# Patient Record
Sex: Female | Born: 1965 | Race: Black or African American | Hispanic: No | Marital: Single | Smoking: Never smoker
Health system: Southern US, Community
[De-identification: ages and names within clinical notes are randomized; demographics above are authoritative.]

## PROBLEM LIST (undated history)

## (undated) DIAGNOSIS — C801 Malignant (primary) neoplasm, unspecified: Secondary | ICD-10-CM

## (undated) DIAGNOSIS — C189 Malignant neoplasm of colon, unspecified: Secondary | ICD-10-CM

## (undated) DIAGNOSIS — T7840XA Allergy, unspecified, initial encounter: Secondary | ICD-10-CM

## (undated) HISTORY — PX: TUBAL LIGATION: SHX77

## (undated) HISTORY — PX: COLON SURGERY: SHX602

## (undated) HISTORY — DX: Allergy, unspecified, initial encounter: T78.40XA

---

## 1997-08-22 ENCOUNTER — Inpatient Hospital Stay (HOSPITAL_COMMUNITY): Admission: AD | Admit: 1997-08-22 | Discharge: 1997-08-22 | Payer: Self-pay | Admitting: Obstetrics and Gynecology

## 1997-09-17 ENCOUNTER — Other Ambulatory Visit: Admission: RE | Admit: 1997-09-17 | Discharge: 1997-09-17 | Payer: Self-pay | Admitting: Obstetrics & Gynecology

## 1997-10-20 ENCOUNTER — Inpatient Hospital Stay (HOSPITAL_COMMUNITY): Admission: AD | Admit: 1997-10-20 | Discharge: 1997-10-22 | Payer: Self-pay | Admitting: Gynecology

## 1999-09-28 ENCOUNTER — Encounter: Payer: Self-pay | Admitting: Emergency Medicine

## 1999-09-28 ENCOUNTER — Emergency Department (HOSPITAL_COMMUNITY): Admission: EM | Admit: 1999-09-28 | Discharge: 1999-09-28 | Payer: Self-pay | Admitting: Emergency Medicine

## 2000-09-24 ENCOUNTER — Emergency Department (HOSPITAL_COMMUNITY): Admission: EM | Admit: 2000-09-24 | Discharge: 2000-09-24 | Payer: Self-pay

## 2000-09-28 ENCOUNTER — Emergency Department (HOSPITAL_COMMUNITY): Admission: EM | Admit: 2000-09-28 | Discharge: 2000-09-28 | Payer: Self-pay | Admitting: Emergency Medicine

## 2001-03-09 ENCOUNTER — Encounter: Payer: Self-pay | Admitting: Emergency Medicine

## 2001-03-09 ENCOUNTER — Emergency Department (HOSPITAL_COMMUNITY): Admission: EM | Admit: 2001-03-09 | Discharge: 2001-03-09 | Payer: Self-pay | Admitting: Emergency Medicine

## 2001-11-27 ENCOUNTER — Emergency Department (HOSPITAL_COMMUNITY): Admission: EM | Admit: 2001-11-27 | Discharge: 2001-11-27 | Payer: Self-pay | Admitting: Emergency Medicine

## 2002-08-06 ENCOUNTER — Emergency Department (HOSPITAL_COMMUNITY): Admission: EM | Admit: 2002-08-06 | Discharge: 2002-08-06 | Payer: Self-pay | Admitting: Emergency Medicine

## 2002-08-06 ENCOUNTER — Encounter: Payer: Self-pay | Admitting: Emergency Medicine

## 2002-11-06 ENCOUNTER — Emergency Department (HOSPITAL_COMMUNITY): Admission: EM | Admit: 2002-11-06 | Discharge: 2002-11-06 | Payer: Self-pay

## 2002-11-06 ENCOUNTER — Encounter: Payer: Self-pay | Admitting: Emergency Medicine

## 2003-09-30 ENCOUNTER — Other Ambulatory Visit: Admission: RE | Admit: 2003-09-30 | Discharge: 2003-09-30 | Payer: Self-pay | Admitting: Obstetrics & Gynecology

## 2003-10-25 ENCOUNTER — Ambulatory Visit (HOSPITAL_COMMUNITY): Admission: RE | Admit: 2003-10-25 | Discharge: 2003-10-25 | Payer: Self-pay | Admitting: Obstetrics & Gynecology

## 2004-11-30 ENCOUNTER — Other Ambulatory Visit: Admission: RE | Admit: 2004-11-30 | Discharge: 2004-11-30 | Payer: Self-pay | Admitting: Obstetrics & Gynecology

## 2005-10-03 ENCOUNTER — Emergency Department (HOSPITAL_COMMUNITY): Admission: EM | Admit: 2005-10-03 | Discharge: 2005-10-04 | Payer: Self-pay | Admitting: Emergency Medicine

## 2005-10-19 ENCOUNTER — Encounter: Admission: RE | Admit: 2005-10-19 | Discharge: 2006-01-17 | Payer: Self-pay | Admitting: Specialist

## 2005-12-05 ENCOUNTER — Emergency Department (HOSPITAL_COMMUNITY): Admission: EM | Admit: 2005-12-05 | Discharge: 2005-12-05 | Payer: Self-pay | Admitting: Emergency Medicine

## 2006-01-14 ENCOUNTER — Emergency Department (HOSPITAL_COMMUNITY): Admission: EM | Admit: 2006-01-14 | Discharge: 2006-01-14 | Payer: Self-pay | Admitting: Emergency Medicine

## 2006-01-30 ENCOUNTER — Emergency Department (HOSPITAL_COMMUNITY): Admission: EM | Admit: 2006-01-30 | Discharge: 2006-01-30 | Payer: Self-pay | Admitting: Emergency Medicine

## 2006-02-07 ENCOUNTER — Encounter: Admission: RE | Admit: 2006-02-07 | Discharge: 2006-05-08 | Payer: Self-pay | Admitting: Specialist

## 2006-05-09 ENCOUNTER — Encounter: Admission: RE | Admit: 2006-05-09 | Discharge: 2006-08-07 | Payer: Self-pay | Admitting: Specialist

## 2007-06-30 ENCOUNTER — Inpatient Hospital Stay (HOSPITAL_COMMUNITY): Admission: AD | Admit: 2007-06-30 | Discharge: 2007-07-01 | Payer: Self-pay | Admitting: Obstetrics & Gynecology

## 2008-01-09 ENCOUNTER — Inpatient Hospital Stay (HOSPITAL_COMMUNITY): Admission: AD | Admit: 2008-01-09 | Discharge: 2008-01-09 | Payer: Self-pay | Admitting: Obstetrics and Gynecology

## 2008-01-29 ENCOUNTER — Inpatient Hospital Stay (HOSPITAL_COMMUNITY): Admission: AD | Admit: 2008-01-29 | Discharge: 2008-01-29 | Payer: Self-pay | Admitting: Obstetrics and Gynecology

## 2008-02-03 ENCOUNTER — Inpatient Hospital Stay (HOSPITAL_COMMUNITY): Admission: AD | Admit: 2008-02-03 | Discharge: 2008-02-03 | Payer: Self-pay | Admitting: Obstetrics & Gynecology

## 2008-02-08 ENCOUNTER — Inpatient Hospital Stay (HOSPITAL_COMMUNITY): Admission: AD | Admit: 2008-02-08 | Discharge: 2008-02-11 | Payer: Self-pay | Admitting: Obstetrics and Gynecology

## 2008-02-08 ENCOUNTER — Encounter (INDEPENDENT_AMBULATORY_CARE_PROVIDER_SITE_OTHER): Payer: Self-pay | Admitting: Obstetrics & Gynecology

## 2008-02-16 ENCOUNTER — Inpatient Hospital Stay (HOSPITAL_COMMUNITY): Admission: EM | Admit: 2008-02-16 | Discharge: 2008-02-20 | Payer: Self-pay | Admitting: Emergency Medicine

## 2008-02-16 ENCOUNTER — Ambulatory Visit: Payer: Self-pay | Admitting: *Deleted

## 2008-02-19 ENCOUNTER — Encounter (INDEPENDENT_AMBULATORY_CARE_PROVIDER_SITE_OTHER): Payer: Self-pay | Admitting: Internal Medicine

## 2008-12-19 ENCOUNTER — Encounter: Admission: RE | Admit: 2008-12-19 | Discharge: 2008-12-19 | Payer: Self-pay | Admitting: Obstetrics & Gynecology

## 2009-03-31 ENCOUNTER — Emergency Department (HOSPITAL_BASED_OUTPATIENT_CLINIC_OR_DEPARTMENT_OTHER): Admission: EM | Admit: 2009-03-31 | Discharge: 2009-03-31 | Payer: Self-pay | Admitting: *Deleted

## 2010-02-16 ENCOUNTER — Ambulatory Visit: Payer: Self-pay | Admitting: Family Medicine

## 2010-02-16 ENCOUNTER — Encounter: Payer: Self-pay | Admitting: Family Medicine

## 2010-02-16 DIAGNOSIS — M722 Plantar fascial fibromatosis: Secondary | ICD-10-CM | POA: Insufficient documentation

## 2010-02-16 DIAGNOSIS — R0609 Other forms of dyspnea: Secondary | ICD-10-CM | POA: Insufficient documentation

## 2010-02-16 DIAGNOSIS — E049 Nontoxic goiter, unspecified: Secondary | ICD-10-CM | POA: Insufficient documentation

## 2010-02-16 DIAGNOSIS — R0989 Other specified symptoms and signs involving the circulatory and respiratory systems: Secondary | ICD-10-CM

## 2010-02-16 DIAGNOSIS — R5383 Other fatigue: Secondary | ICD-10-CM

## 2010-02-16 DIAGNOSIS — R5381 Other malaise: Secondary | ICD-10-CM | POA: Insufficient documentation

## 2010-02-16 DIAGNOSIS — E669 Obesity, unspecified: Secondary | ICD-10-CM | POA: Insufficient documentation

## 2010-02-16 DIAGNOSIS — J309 Allergic rhinitis, unspecified: Secondary | ICD-10-CM | POA: Insufficient documentation

## 2010-02-17 ENCOUNTER — Telehealth (INDEPENDENT_AMBULATORY_CARE_PROVIDER_SITE_OTHER): Payer: Self-pay | Admitting: *Deleted

## 2010-02-17 LAB — CONVERTED CEMR LAB: Vit D, 25-Hydroxy: 34 ng/mL (ref 30–89)

## 2010-02-19 ENCOUNTER — Telehealth (INDEPENDENT_AMBULATORY_CARE_PROVIDER_SITE_OTHER): Payer: Self-pay | Admitting: *Deleted

## 2010-02-19 LAB — CONVERTED CEMR LAB
ALT: 17 units/L (ref 0–35)
Albumin: 3.8 g/dL (ref 3.5–5.2)
Eosinophils Absolute: 0 10*3/uL (ref 0.0–0.7)
Eosinophils Relative: 1.5 % (ref 0.0–5.0)
MCV: 96.3 fL (ref 78.0–100.0)
Monocytes Absolute: 0.3 10*3/uL (ref 0.1–1.0)
Monocytes Relative: 14.3 % — ABNORMAL HIGH (ref 3.0–12.0)
Neutrophils Relative %: 39.1 % — ABNORMAL LOW (ref 43.0–77.0)
Platelets: 220 10*3/uL (ref 150.0–400.0)
RBC: 3.82 M/uL — ABNORMAL LOW (ref 3.87–5.11)
RDW: 13.8 % (ref 11.5–14.6)
Total Bilirubin: 0.5 mg/dL (ref 0.3–1.2)
WBC: 2.4 10*3/uL — ABNORMAL LOW (ref 4.5–10.5)

## 2010-02-24 ENCOUNTER — Encounter
Admission: RE | Admit: 2010-02-24 | Discharge: 2010-02-24 | Payer: Self-pay | Source: Home / Self Care | Attending: Family Medicine | Admitting: Family Medicine

## 2010-02-24 ENCOUNTER — Ambulatory Visit: Payer: Self-pay | Admitting: Pulmonary Disease

## 2010-02-24 ENCOUNTER — Ambulatory Visit: Payer: Self-pay | Admitting: Family Medicine

## 2010-02-25 ENCOUNTER — Ambulatory Visit (HOSPITAL_BASED_OUTPATIENT_CLINIC_OR_DEPARTMENT_OTHER)
Admission: RE | Admit: 2010-02-25 | Discharge: 2010-02-25 | Payer: Self-pay | Source: Home / Self Care | Attending: Pulmonary Disease | Admitting: Pulmonary Disease

## 2010-02-25 ENCOUNTER — Encounter: Payer: Self-pay | Admitting: Pulmonary Disease

## 2010-02-25 LAB — CONVERTED CEMR LAB
Basophils Relative: 0.4 % (ref 0.0–3.0)
Eosinophils Absolute: 0.2 10*3/uL (ref 0.0–0.7)
HCT: 37.3 % (ref 36.0–46.0)
Lymphocytes Relative: 25.5 % (ref 12.0–46.0)
Lymphs Abs: 2.3 10*3/uL (ref 0.7–4.0)
MCHC: 34 g/dL (ref 30.0–36.0)
MCV: 95.1 fL (ref 78.0–100.0)
Monocytes Relative: 6.8 % (ref 3.0–12.0)

## 2010-02-26 ENCOUNTER — Encounter: Admit: 2010-02-26 | Payer: Self-pay | Admitting: Family Medicine

## 2010-03-07 ENCOUNTER — Telehealth: Payer: Self-pay | Admitting: Pulmonary Disease

## 2010-03-19 ENCOUNTER — Other Ambulatory Visit: Payer: Self-pay | Admitting: Obstetrics & Gynecology

## 2010-03-22 ENCOUNTER — Encounter: Payer: Self-pay | Admitting: Family Medicine

## 2010-03-27 ENCOUNTER — Encounter
Admission: RE | Admit: 2010-03-27 | Discharge: 2010-03-27 | Payer: Self-pay | Source: Home / Self Care | Attending: Obstetrics & Gynecology | Admitting: Obstetrics & Gynecology

## 2010-04-02 NOTE — Progress Notes (Signed)
  Phone Note Outgoing Call   Summary of Call: Let her know - stopped breathing very few times - not enough to warrant intervention, wt loss advised, increase sleep time to at least 7 hrs/ night if possible Initial call taken by: Comer Locket. Vassie Loll MD,  March 07, 2010 10:02 PM  Follow-up for Phone Call        informed pt of RA's recs. Zackery Barefoot CMA  March 10, 2010 10:05 AM

## 2010-04-02 NOTE — Assessment & Plan Note (Signed)
Summary: cpx ///sph--needs afternoon appt///sph   Vital Signs:  Patient profile:   45 year old female Height:      63 inches Weight:      226 pounds BMI:     40.18 Pulse rate:   82 / minute BP sitting:   114 / 80  (left arm)  Vitals Entered By: Doristine Devoid CMA (February 16, 2010 2:57 PM) CC: NEW EST- husband complains of snoring and having bilateral feet and ankle pain    History of Present Illness: 45 yo woman here today to establish care.  previous MD- none.  GYNAldona Bar.  1) Fatigue- started a new job.  has 44 yo boy  and 2 yo daughter.  despite getting adequate sleep is waking up tired and having day time sleepiness.  2) Snoring- husband c/o pt's snoring and reports that she stops breathing multiple times/night.  he reports this is a recent development.  pt reports she has recently gained weight.  3) Obesity- 'i feel like i'm failing this food thing- i'm failing horribly'.  'i'm out of control'.  a few months ago tried physicians weight loss.  not exercising.  4) foot pain- R foot has arch and heel pain.  sxs started 'a long time ago'.  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  1st step in AM is most painful.  L foot has pain along the dorsal/lateral foot along w/ similar arch pain.  Preventive Screening-Counseling & Management  Alcohol-Tobacco     Alcohol drinks/day: <1     Alcohol type: wine     Smoking Status: never  Caffeine-Diet-Exercise     Does Patient Exercise: no      Sexual History:  currently monogamous.        Drug Use:  never.    Current Medications (verified): 1)  Multivitamins  Caps (Multiple Vitamin) .... Take One Tablet Daily  Allergies: 1)  ! Amoxicillin  Past History:  Past Medical History: hx of chicken pox  Past Surgical History: csection- 2009  Family History: CAD-no HTN-mother DM-no STROKE-no COLON CA-no BREAST CA-no  Social History: married 2 children- daughter (2009), son (1999) Cosmetologist callibrates meters at  [employer] Smoking Status:  never Does Patient Exercise:  no Sexual History:  currently monogamous Drug Use:  never  Review of Systems      See HPI  Physical Exam  General:  Well-developed,well-nourished,in no acute distress; alert,appropriate and cooperative throughout examination.  overwt Head:  NCAT Eyes:  PERRL, EOMI, fundi WNL Nose:  markedly edematous turbinates Mouth:  crowded posterior pharynx Neck:  thyromegaly Lungs:  Normal respiratory effort, chest expands symmetrically. Lungs are clear to auscultation, no crackles or wheezes. Heart:  Normal rate and regular rhythm. S1 and S2 normal without gallop, murmur, click, rub or other extra sounds. Abdomen:  soft, NT/ND, +BS Msk:  + TTP over plantar fascia bilaterally Pulses:  +2 carotid, radial, DP Extremities:  No clubbing, cyanosis, edema, or deformity noted with normal full range of motion of all joints.   Skin:  turgor normal and color normal.   Cervical Nodes:  No lymphadenopathy noted Psych:  Cognition and judgment appear intact. Alert and cooperative with normal attention span and concentration. No apparent delusions, illusions, hallucinations   Impression & Recommendations:  Problem # 1:  FATIGUE (ICD-780.79) Assessment New likely multifactorial- working 2 jobs, has 2 children, not exercising or eating well, ? sleep apnea.  check labs to r/o metabolic disturbance or thyroid abnormality.  reviewed sleep hygeine. Orders: Venipuncture (  [phone]) T-Vitamin D (25-Hydroxy) [phone]) Specimen Handling (08657) TLB-TSH (Thyroid Stimulating Hormone) (84443-TSH) TLB-CBC Platelet - w/Differential (85025-CBCD) TLB-Hepatic/Liver Function Pnl (80076-HEPATIC)  Problem # 2:  SNORING (ICD-786.09) Assessment: New pt's sxs and recent weight gain consistent w/ sleep apnea.  refer to pulm for evaluation and tx. Orders: Pulmonary Referral (Pulmonary)  Problem # 3:  PLANTAR FASCIITIS, BILATERAL (ICD-728.71) Assessment: New start  NSAIDs.  demonstrated stretching.  recommended ice and cushioned shoes. Her updated medication list for this problem includes:    Naprosyn 500 Mg Tabs (Naproxen) .Marland Kitchen... 1 two times a day x10 days and then as needed.  take w/ food.  Problem # 4:  OBESITY (ICD-278.00) Assessment: New pt not paying attention to diet or exercising.  discussed importance of both, refer to nutrition. Orders: Nutrition Referral (Nutrition)  Problem # 5:  THYROMEGALY (ICD-240.9) Assessment: New refer for [place] Orders: Radiology Referral (Radiology)  Problem # 6:  RHINITIS (ICD-477.9) Assessment: New start nasal steroid to decrease airway congestion Her updated medication list for this problem includes:    Fluticasone Propionate 50 Mcg/act Susp (Fluticasone propionate) .Marland Kitchen... 2 sprays each nostril daily  Complete Medication List: 1)  Multivitamins Caps (Multiple vitamin) .... Take one tablet daily 2)  Naprosyn 500 Mg Tabs (Naproxen) .Marland Kitchen.. 1 two times a day x10 days and then as needed.  take w/ food. 3)  Fluticasone Propionate 50 Mcg/act Susp (Fluticasone propionate) .... 2 sprays each nostril daily  Patient Instructions: 1)  Please schedule a fasting lab visit at your convenience 2)  BMP- 278.0 3)  Lipid panel- 278.0 4)  We'll notify you of your lab results 5)  Someone will call you with your thyroid ultrasound and sleep study appts 6)  Start the Nasonex to decrease your nasal congestion caused by allergies 7)  We'll also refer you to a nutritionist to help with the weight loss 8)  Ice your feet at least two times a day- roll over the soda bottle 9)  Do the stretches I showed you for your feet at least 2x/day 10)  Call with any questions or concerns 11)  Welcome!  We're glad to have you!!! Prescriptions: NASONEX 50 MCG/ACT SUSP (MOMETASONE FUROATE) 2 sprays each nostril once daily  #1 x 3   Entered and Authorized by:   [name] MD   Signed by:   [name] MD on 02/16/2010   Method used:    [facility]        [facility] [phone]* (retail)       [address]       [place], [place]  [address]       Ph: [phone]       Fax: [phone]   RxID:   [id number] NAPROSYN 500 MG TABS (NAPROXEN) 1 two times a day x10 days and then as needed.  take w/ food.  #60 x 1   Entered and Authorized by:   [name] MD   Signed by:   [name] MD on 02/16/2010   Method used:   [facility] to        [facility] [phone]* (retail)       [address]       [place], [place]  [address]       Ph: [phone]       Fax: [phone]   RxID:   [id number]    Orders Added: 1)  Venipuncture [id number] 2)  T-Vitamin D (25-Hydroxy) [phone] 3)  Specimen Handling [99000] 4)  Nutrition Referral [Nutrition] 5)  Pulmonary Referral [Pulmonary] 6)  Radiology Referral [Radiology] 7)  TLB-TSH (Thyroid Stimulating Hormone) [84443-TSH] 8)  TLB-CBC Platelet - w/Differential [85025-CBCD] 9)  TLB-Hepatic/Liver Function Pnl [80076-HEPATIC] 10)  New Patient Level III [09811]

## 2010-04-02 NOTE — Assessment & Plan Note (Signed)
Summary: SNORING/MH   Visit Type:  Initial Consult Copy to:  pcp Primary [name]/Referring [name]:  [name] MD  CC:  Snoring.  History of Present Illness: 44/F for evaluation of sleep disordered brathing husband notes loud snoring, witnessed apneas Exhausted x few months , new job since october  bedtime 10 p, 45 y.o. in her own bed, sleeps on her side x  3p, no nocturia, oob at 0500, tired, occ headaches, 1 cup coffee Epworth Sleepiness Score 15 Days off work - oob at 0700,  naps x1h, somewhat refrshing Gained 20 lbs last 2 y  Preventive Screening-Counseling & Management  Alcohol-Tobacco     Alcohol drinks/day: occ     Alcohol type: wine     Smoking Status: quit     Packs/Day: 0.25     Year Started: 1991     Year Quit: 1996   History of Present Illness: Snoring, sound like I stop breathing, wake up tired  What time do you typically go to bed?(between what hours): 10-11pm   How many times during the night do you wake up? I don't know  What time do you get out of bed to start your day? 5am  Do you drive or operate heavy machinery in your occupation? no  How much has your weight changed (up or down) over the past two years? (in pounds): 30 lb increase  Have you ever had a sleep study before?  If yes,when and where: no  Do you currently use CPAP ? If so , at what pressure? no  Do you wear oxygen at any time? If yes, how many liters per minute? no Current Medications (verified): 1)  Multivitamins  Caps (Multiple Vitamin) .... Take One Tablet Daily 2)  Naprosyn 500 Mg Tabs (Naproxen) .Marland Kitchen.. 1 Two Times A Day X10 Days and Then As Needed.  Take W/ Food. 3)  Fluticasone Propionate 50 Mcg/act Susp (Fluticasone Propionate) .... 2 Sprays Each Nostril Daily  Allergies (verified): 1)  ! 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  Smoking Status:  quit Packs/Day:  0.25 Alcohol drinks/day:  occ  Review of Systems       The patient complains of shortness of breath with activity, nasal congestion/difficulty breathing through nose, sneezing, depression, hand/feet swelling, and joint stiffness or pain.  The patient denies shortness of breath at rest, productive cough, non-productive cough, coughing up blood, chest pain, irregular heartbeats, acid heartburn, indigestion, loss of appetite, weight change, abdominal pain, difficulty swallowing, sore throat, tooth/dental problems, headaches, itching, ear ache, anxiety, rash, change in color of mucus, and fever.    Vital Signs:  Patient profile:   45 year old female Height:      63.25 inches Weight:      229.2 pounds BMI:     40.43 O2 Sat:      100 % on Room air Temp:     98.1 degrees F oral Pulse rate:   74 / minute BP sitting:   100 / 74  (left arm) Cuff  size:   large  Vitals Entered By: Zackery Barefoot CMA (February 24, 2010 11:02 AM)  O2 Flow:  Room air CC: Snoring Comments Medications reviewed with patient Verified contact number and pharmacy with patient Zackery Barefoot CMA  February 24, 2010 11:02 AM    Physical Exam  Additional Exam:  Gen. Pleasant, well-nourished, in no distress, normal affect ENT - no lesions, no post nasal drip, class 3 airway, prognathia Neck: No JVD, no thyromegaly, no carotid  bruits Lungs: no use of accessory muscles, no dullness to percussion, clear without rales or rhonchi  Cardiovascular: Rhythm regular, heart sounds  normal, no murmurs or gallops, no peripheral edema Abdomen: soft and non-tender, no hepatosplenomegaly, BS normal. Musculoskeletal: No deformities, no cyanosis or clubbing Neuro:  alert, non focal     Impression & Recommendations:  Problem # 1:  SNORING (ICD-786.09)  Given wtinessed apneas, excessive daytime somnolence & loud snoring, obstructive sleep apnea is very likely & an overnight PSG will be scheduled as a split study The pathophysiology of obstructive sleep apnea, it's cardiovascular consequences and modes of treatment including CPAP were discussed with the patient in great detail.   Orders: Sleep Disorder Referral (Sleep Disorder) Consultation Level III (16109)  Patient Instructions: 1)  Copy sent  dr Beverely Low 2)  Please schedule a follow-up appointment in 2 weeks after sleep study

## 2010-04-02 NOTE — Progress Notes (Signed)
Summary: labs  Phone Note Outgoing Call   Call placed by: Doristine Devoid CMA,  February 19, 2010 2:17 PM Call placed to: Patient Summary of Call: WBC is mildly low- this could be a normal variation but should be repeated in 1 week to trend.  thyroid is normal, she's not anemic, and liver is normal  Follow-up for Phone Call        spoke w/ patient aware of labs and appt scheduled to recheck labs........Marland KitchenDoristine Devoid CMA  February 19, 2010 2:20 PM

## 2010-04-02 NOTE — Progress Notes (Signed)
Summary: either med available in generic??  Phone Note Refill Request Message from:  Patient on February 17, 2010 1:06 PM  Refills Requested: Medication #1:  NAPROSYN 500 MG TABS 1 two times a day x10 days and then as needed.  take w/ food.  Medication #2:  NASONEX 50 MCG/ACT SUSP 2 sprays each nostril once daily. are either of these medciations available in generic that Medicaid will pay for???    says that two prescriptions will cost her $185.00!!!    cannot use new primary  insurance  at pharmacy, only can use Medicaid-----please call cell [phone]--ok to leave message, but she should answer       [facility] street  Initial call taken by: Jerolyn Shin,  February 17, 2010 1:09 PM  Follow-up for Phone Call        Pls advise.........Marland KitchenFelecia Deloach CMA  February 17, 2010 2:45 PM   Additional Follow-up for Phone Call Additional follow up Details #1::        nasonex no---it can be switched to flonase or nasacort naprosyn the pharmacy should have filled with generic Additional Follow-up by: Loreen Freud DO,  February 17, 2010 3:19 PM    Additional Follow-up for Phone Call Additional follow up Details #2::    Left message to call office...........Marland KitchenFelecia Deloach CMA  February 17, 2010 3:53 PM   Additional Follow-up for Phone Call Additional follow up Details #3:: Details for Additional Follow-up Action Taken: naprosyn is on the $4 list at [facility].  fluticasone is a generic nasal spray- still 2 sprays each nostril daily.  Pt aware...........Marland KitchenFelecia Deloach CMA  February 18, 2010 9:10 AM  Additional Follow-up by: [name] MD,  February 17, 2010 8:48 PM  New/Updated Medications: FLUTICASONE PROPIONATE 50 MCG/ACT SUSP (FLUTICASONE PROPIONATE) 2 sprays each nostril daily Prescriptions: FLUTICASONE PROPIONATE 50 MCG/ACT SUSP (FLUTICASONE PROPIONATE) 2 sprays each nostril daily  #1 x 3   Entered by:   Jeremy Johann CMA   Authorized by:    Loreen Freud DO   Signed by:   Jeremy Johann CMA on 02/18/2010   Method used:   [facility]ed to ...       [facility] [phone]* (retail)       [address]       [place], [place]  [address]       Ph: [phone]       Fax: [phone]   RxID:   [id number]

## 2010-07-14 NOTE — H&P (Signed)
[name], [name]               ACCOUNT NO.:  [id number]   MEDICAL RECORD NO.:  [id number]          PATIENT TYPE:  INP   LOCATION:  0107                         FACILITY:  [facility]   PHYSICIAN:  Vania Rea, M.D. DATE OF BIRTH:  02/04/1966   DATE OF ADMISSION:  02/16/2008  DATE OF DISCHARGE:                              HISTORY & PHYSICAL   PRIMARY CARE PHYSICIAN:  Unassigned.   OBSTETRICIAN/GYNECOLOGIST:  Gerrit Friends. Aldona Bar, M.D.   CHIEF COMPLAINT:  Shortness of breath and chest tightness for the past  24 hours.   HISTORY OF PRESENT ILLNESS:  This is a 45 year old African American lady  who delivered her second child by cesarean section 8 days ago, had lower  extremity edema immediately postpartum but has been ambulating and told  that would eventually clear.  She was discharged home after a 3-day  hospital stay and has been breast feeding and active without too much  difficulty other than the soreness of her abdomen status post a cesarean  section.  Since yesterday the patient has been having what she called a  chest tightness and some shortness of breath.  She did have chills last  night but has not noticed any frank fever.  She says she has been having  a dry cough but she denies chest pain.  She denies orthopnea or PND.  She reports that the swelling of her legs is not worse.  The patient  eventually came to the emergency room to be evaluated and the  hospitalist service was called to assist in management.   PAST MEDICAL HISTORY:  She does have past history of bronchitis.  No  history of heart disease and no history of thrombocytopenia.  No history  of thyroid disease.   MEDICATIONS:  Colace 100 mg daily, prenatal vitamins 1 daily, Vicodin  5/500 one to two tablets every 4 hours when necessary, ibuprofen 800 mg  3 times daily.   ALLERGIES:  PENICILLIN.  She does not know the reaction; it was so long  ago.   SOCIAL HISTORY:  She denies tobacco, alcohol or illicit drug  use.  She  works as a Associate Professor.   FAMILY HISTORY:  Significant for hypertension in her mother and brother.   REVIEW OF SYSTEMS:  Other than noted above, a 10-point review of systems  is unremarkable.   PHYSICAL EXAM:  A young African American lady reclining on the  stretcher, somewhat obese, but in no acute distress.  She has received  Lasix and Avelox.  VITALS:  Her temperature is 98.8.  Her pulse is 60, respirations 22,  blood pressure 128/69.  She is saturating 96% on room air.  Her pupils  round, equal and reactive.  Mucous mucous membranes are pink and  anicteric.  She is not dehydrated.  No cervical lymphadenopathy.  Her thyroid is somewhat prominent.  Her chest is mostly clear.  She does have occasional rhonchi.  Cardiovascular system regular rhythm without murmur or gallop.  Her abdomen is obese but soft.  She does have some evidence of  intertriginous Candida in the folds of her pannus.  EXTREMITIES:  She has tense edema of 3+ bilaterally, right greater than  left, but no tenderness and no evidence of inflammation.  CENTRAL NERVOUS SYSTEM:  Cranial nerves II-XII are grossly intact.  She  has no focal neurologic deficit.   LABS:  Her CBC is compatible with postpartum.  Her white count is normal  at 9.5.  Her hemoglobin is a little low at 10.4, MCV is 100.3, her  platelets are 276.  She has 79% neutrophils.  Her absolute granulocyte  count is normal at 7.5.  Her serum chemistry is completely normal.  BUN  14 and creatinine 0.7.  Her brain natriuretic peptide is slightly  elevated at 347.  Her EKG shows a normal sinus rhythm and, in fact,  sinus bradycardia with 52 beats per minute when initially done.  A chest  x-ray, two views, shows diffuse peribronchial thickening and a right  lower lobe pneumonia with a small right pleural effusion.   ASSESSMENT:  1. Lobar pneumonia status post cesarean section with probable      consequent decreased ventilation.  2. Bilateral  lower extremity edema started in the acute postpartum.  3. Postpartum period acute bronchitis.   PLAN:  1. Will bring this lady in to start her on antibiotics.  Will give her      incentive spirometry on a flutter valve. Because there is still a      possibility that with this she may have some postpartum      cardiomyopathy, will go ahead and get an echo.  This does not have      the appearance of a pulmonary embolus but will go ahead and get      bilateral lower extremity Dopplers and if positive, we will go      ahead and get a CT angiogram.  2. The patient has a history of PENICILLIN ALLERGY but Rocephin and      Zithromax in this patient is preferable to Avelox, which is a      quinolone, and so will go ahead and give Rocephin but will monitor      for any signs of allergy.  3. Because of her prominent thyroid, will go ahead and check her TSH      and free T4.  Other plans as per orders.      Vania Rea, M.D.  Electronically Signed     LC/MEDQ  D:  02/16/2008  T:  02/16/2008  Job:  [id number]   cc:   Gerrit Friends. Aldona Bar, M.D.  Fax: [phone]

## 2010-07-14 NOTE — Op Note (Signed)
Glenda Hudson, AUER               ACCOUNT NO.:  [id number]   MEDICAL RECORD NO.:  [id number]          PATIENT TYPE:  INP   LOCATION:  9127                          FACILITY:  [facility]   PHYSICIAN:  Gerrit Friends. Aldona Bar, M.D.   DATE OF BIRTH:  1965/04/25   DATE OF PROCEDURE:  02/08/2008  DATE OF DISCHARGE:                               OPERATIVE REPORT   PREOPERATIVE DIAGNOSES:  1. Term pregnancy.  2. Active labor.  3. Failure to progress.  4. Desire for permanent elective sterilization.   POSTOPERATIVE DIAGNOSES:  1. Term pregnancy.  2. Active labor.  3. Failure to progress.  4. Desire for permanent elective sterilization.  5. Delivery of 8 pounds 1 ounce female infant, Apgars 8 and 9 and      segments of each fallopian tube - pathology pending.   PROCEDURE:  Primary low transverse cesarean section and tubal  sterilization procedure.   SURGEON:  Gerrit Friends. Aldona Bar, MD   ANESTHESIA:  Augmented epidural.   HISTORY:  This is gravida 2, para 27, 45 year old patient entered at term  in labor on the morning of February 08, 2008.  At the time of admission,  she was about 6 cm dilated with bulging membranes and the vertex was  about -3 station - ballottable.  She progressed to about 7-8 cm of  dilatation by 9:00 a.m. and was allowed to labor.  She felt to be almost  fully dilated with a vertex of about -2 station at around 1:00 p.m. and  on examination, ruptured membranes occurred with production of slightly  meconium-stained fluid.  Fetal heart rate was reassuring.  Pushing was  begun.  Unfortunately, during the attempted second stage, her cervix  became very edematous and on reevaluation after approximately 30 minutes  of pushing, was no more than 7 cm dilated - similarly to what she was  about 5 hours earlier.  She was allowed to rest for approximately an  hour and there was really no change despite of continued good  contractions.  Because of failure to progress, she was taken to the  operating for primary low transverse cesarean section and because she  was 45 years old and had previously thought about a sterilization  procedure, even though Medicaid papers were not signed, she elected for  a permanent elective sterilization procedure and decision was made to go  ahead and carry that out at that time of her cesarean section after  informed consent was given by the patient.   Accordingly, she was taken to the operating room where after the  satisfactory augmentation of her epidural and a dose of Ancef, 1 gram  IV, she was prepped and draped in usual fashion having placed in supine  position slightly tilted to the left.  A Foley catheter had been  previously inserted in the labor and delivery area.   Once she was adequately draped and good anesthetic levels were  documented, procedure was begun.  A Pfannenstiel incision was made and  with minimal difficulty dissected down sharply to and through the fascia  in low transverse fashion with hemostasis  created at each layer.  Subfascial space was created inferiorly and superiorly and muscles  separated midline.  Identified and appropriate with care taken to avoid  the bowel superiorly and the bladder inferiorly.  At this time, the  vesicouterine peritoneum was identified, incised in low transverse  fashion, pushed off the lower uterine segment with ease and sharp  incision into the lower segment was carried out using Metzenbaum  scissors and extended laterally with the fingers.  Thereafter, elevation  of the vertex was carried out and delivery of a viable female infant  which cried spontaneously once was accomplished.  After the cord was  clamped and cut, the infant was passed off the awaiting team and  subsequently taken to nursery in good condition.  Subsequent weight was  found to be 8 pounds 1 ounce and Apgars were noted to be 8 and 9.   As the patient was a cord blood donor, placenta was delivered and passed  off.   Cord bloods will be obtained by the cord blood donation team and  placenta will subsequently be sent to pathology as she did have meconium-  stained fluid.   At this time, the uterus was exteriorized and rendered free of any  remaining products of conception.  Good uterine contractility was  afforded, was slowly given intravenous Pitocin and manual stimulation.  At this time, closure of the uterine incision was carried out using #1  Vicryl in a running-locking fashion and this was oversewn with several  figure-of-eight #1 Vicryl for additional hemostasis.  At this time, the  uterine incision was closed and hemostatic.  Attention was turned to  each fallopian tube which appeared normal with the exception of some  very filmy adhesions.  Nonetheless, it was possible to carry out a  Pomeroy tubal sterilization procedure on each fallopian tube using #1  plain catgut suture and the knuckle of tube was excised from each  fallopian tube and sent to pathology, appropriately labeled.  Hemostasis  was adequate.  At this time, the uterus is well contracted.  Uterine  incision was well approximated and hemostatic and with all counts being  correct and no foreign bodies noted to be remaining in abdominal cavity.  The uterus was replaced into the abdominal cavity and after the abdomen  was lavaged of all free blood and clot, closure of the abdomen was begun  in layers.  The abdominal peritoneum was closed with 0 Vicryl in a  running fashion and muscle secured with same.  Assured of good  subfascial hemostasis, the fascia was then reapproximated using 0 Vicryl  from angle to midline bilaterally.  Subcutaneous tissue was rendered  hemostatic, and staples were then used to close the skin.  A sterile  pressure dressing was applied and the patient at this time was  transported to recovery in satisfactory condition having tolerated the  procedure well.  Estimated blood loss 500 mL.  All counts were correct   x2.   In summary, this patient presented in an active labor, but unfortunately  failed to progress beyond 7 cm, and was ultimately taken to the  operating room for primary low transverse cesarean section and also  underwent a tubal sterilization procedure after informed consent.  Although, she was a Medicaid patient, a form was not signed for  sterilization 30 days in advance, but because of the situation and  carrying out the cesarean section and the patient' age, etc., decision  was made to proceed with such procedure  as the cesarean section was  totally unplanned and unexpected.   Condition on arrival to the recovery room satisfactory.      Gerrit Friends. Aldona Bar, M.D.  Electronically Signed     RMW/MEDQ  D:  02/08/2008  T:  02/09/2008  Job:  [id number]

## 2010-07-14 NOTE — Discharge Summary (Signed)
Glenda Hudson, Glenda Hudson               ACCOUNT NO.:  [id number]   MEDICAL RECORD NO.:  [id number]          PATIENT TYPE:  INP   LOCATION:  1433                         FACILITY:  [facility]   PHYSICIAN:  Hillery Aldo, M.D.   DATE OF BIRTH:  1966-02-09   DATE OF ADMISSION:  02/16/2008  DATE OF DISCHARGE:  02/20/2008                               DISCHARGE SUMMARY   PRIMARY CARE PHYSICIAN:  None.   OBSTETRICIAN/GYNECOLOGIST:  Gerrit Friends. Aldona Bar, M.D.   DISCHARGE DIAGNOSES:  1. Congestive heart failure likely secondary to volume overload.  2. Bilateral small pleural effusions.  3. Atypical pneumonia  4. Macrocytic anemia.  5. Decreased free T4, TSH within normal limits.  6. Hypertension.   DISCHARGE MEDICATIONS:  1. Hydrochlorothiazide 25 mg daily.  2. Colace 100 mg daily p.r.n.  3. Prenatal vitamin daily.  4. Vicodin 5/500 one to two every 4 hours p.r.n.  5. Ibuprofen 800 mg every 8 hours p.r.n.   CONSULTATIONS:  None.   BRIEF ADMISSION HISTORY OF PRESENT ILLNESS:  The patient is a 45-year-  old female who is recently postpartum x8 days and who presented to the  hospital with worsening lower extremity edema and progressive  development of shortness of breath and chest tightness.  She was  subsequently admitted due to concerns for congestive heart failure.  For  the full details, please see the dictated report done by Dr. Orvan Falconer.   PROCEDURES AND DIAGNOSTIC STUDIES:  1. Chest x-ray on February 16, 2008 showed diffuse peribronchial      thickening and a right lower lobe pneumonia with a small right      pleural effusion.  2. CT angiogram of the chest on February 17, 2008 showed no evidence      of pulmonary embolism.  Findings were consistent with congestive      heart failure with bilateral small pleural effusions, distended      pulmonary vasculature and patchy air space opacities in the right      lung.  A component of atypical infection in the right lung cannot      be excluded  based on appearance.  3. Lower extremity Dopplers on February 19, 2008 showed no evidence of      DVT or superficial thrombosis.  4. Two-dimensional echocardiogram on February 19, 2008 showed normal      left ventricular systolic function with an ejection fraction at      65%.  The left atrium was mildly dilated.  No pericardial effusion.   DISCHARGE LABORATORY VALUES:  Sodium was 137, potassium 3.8, chloride  105, bicarb 25, BUN 10, creatinine 0.64, glucose 86.  White blood cell  count was 8.2, hemoglobin 10.6, hematocrit 31.4, platelets 265.   HOSPITAL COURSE BY PROBLEM:  1. Dyspnea/chest tightness thought to be secondary to acute congestive      heart failure from volume overload in the setting of a possible      bronchitis with atypical pneumonia:  The patient was admitted and      initially empirically put on Rocephin and azithromycin to treat the  possibility of pneumonia.  She had no complaints of fever and had      no symptoms suggestive of an infection in her lungs, and the      preponderance of the evidence based on radiographic studies and BNP      values was that her dyspnea was due to pulmonary edema.  Pulmonary      embolism was ruled out with CT angiogram.  The patient was      empirically started on Lasix and Lotensin to treat a possible      postpartum cardiomyopathy prior to obtaining the two-dimensional      echocardiogram.  Two-dimensional echocardiogram was completely      normal.  Nevertheless, the patient diuresed over 6 liters from her      admission and it is unclear as to why she developed such a severe      pulmonary edema in the setting of normal cardiac function.      Nevertheless, she was somewhat hypertensive and this may have been      due to an acute diastolic issue.  Nevertheless, the patient has      been diuresed and we will discharge her on hydrochlorothiazide to      treat her underlying hypertension.  She has been instructed to      follow up  with her OB/GYN and to not use her breast milk for breast      feeding purposes until she clears it with him.  2. Hypertension:  The patient did have blood pressure readings that      were elevated with systolic pressures in one 60s and diastolic      pressures in the 100s.  Again, she was diuresed and her discharge      blood pressure is 130/68.  We are discharging her on      hydrochlorothiazide therapy for ongoing treatment of her      hypertension.  However, she will need close followup to ensure that      her blood pressure remains well controlled.  3. Macrocytic anemia:  The patient did have an anemia panel done.  Her      iron is low at 37 with a total iron binding capacity of 367, 10%      saturation.  Serum folate was normal as 16.3.  Ferritin was 29.      Vitamin B12 was 572.  These findings are consistent with iron      deficiency in the setting of a recent pregnancy.  She can follow up      with her primary care physician or OB/GYN for consideration of      starting iron supplementation therapy if warranted.  4. Low free T4:  The patient's TSH was normal and her free T4 was      slightly low at 0.77.  This likely represents changes associated      with pregnancy and therefore she was not immediately started on      thyroid supplementation therapy.  She should follow up with her      OB/GYN to see if thyroid hormone replacement is warranted..   DISPOSITION:  The patient is medically stable for discharge and will be  discharged home.  She is encouraged to follow up with her  obstetrician/gynecologist within 1 week.   Time spent coordinating care for discharge and discharge instructions  equals 35 minutes.      Hillery Aldo, M.D.  Electronically Signed  CR/MEDQ  D:  02/20/2008  T:  02/20/2008  Job:  [id number]   cc:   Gerrit Friends. Aldona Bar, M.D.  Fax: [phone]

## 2010-07-17 NOTE — Discharge Summary (Signed)
Glenda Hudson, [name]               ACCOUNT NO.:  [id number]   MEDICAL RECORD NO.:  [id number]          PATIENT TYPE:  INP   LOCATION:  9127                          FACILITY:  [facility]   PHYSICIAN:  Randye Lobo, M.D.   DATE OF BIRTH:  [birth year]   DATE OF ADMISSION:  02/08/2008  DATE OF DISCHARGE:  02/11/2008                               DISCHARGE SUMMARY   FINAL DIAGNOSES:  Intrauterine pregnancy at term, active labor, failure  to progress.  The patient desires permanent sterilization with the  patient's advanced maternal age, had an elevated Down syndrome risk on  her first trimester screen and declined amniocentesis.  The patient also  was being followed for a cystic mass noted on the posterior brain of the  fetus, which resolved as the pregnancy progressed.   PROCEDURE:  Primary low transverse cesarean section with tubal  sterilization.   SURGEON:  Gerrit Friends. Aldona Bar, MD   COMPLICATIONS:  None.   This 45 year old, G2, P42, presents in active labor on the morning of  February 08, 2008.  The patient's antepartum course up to this point had  been complicated by advanced maternal age.  The patient had a first  trimester screen which showed an elevated Down syndrome risk of 1:170.  She declined the amniocentesis.  She did have an ultrasound performed  with the perinatal which showed a cystic mass in the posterior brain of  the fetus.  This continued to be followed throughout the pregnancy and  it did end up resolving.  Otherwise, the patient's antepartum course had  been uncomplicated.  She did have a negative group B strep culture  obtained in our office at 36 weeks.   PATIENT'S HOSPITAL COURSE:  She presents in active labor.  She  progressed to complete rupture of membranes occurred with slightly  meconium-stained amniotic fluid.  The patient began pushing, but during  this time, her cervix became very edematous and cervix when evaluated,  it was really only 7-cm dilated.  Because  of this failure to progress, a  discussion was held with the patient and the decision was made to  proceed with cesarean section and she still expressed desires for  permanent sterilization as well.  The patient was taken to the operating  room on February 08, 2008, by Dr. Annamaria Helling, where a primary low  transverse cesarean section was performed with the delivery of an 8  pound and 1 ounce female infant with Apgars of 8 and 9.  Delivery went  without complications.  At that point, an elective permanent  sterilization procedure was performed by Dr. Aldona Bar without complications.  The patient's postoperative course was uncomplicated.  She was felt  ready for discharge.  On postoperative day #3, she was sent home on a  regular diet, told to decrease activities, told to continue her  vitamins, was given a prescription for Vicodin 1-2 every 6 hours as  needed for pain, told she could use ibuprofen up to 600 mg every 6 hours  as needed for pain, was to follow up in our office in 4-6 weeks.  Instructions and precautions were reviewed with the patient.   LABS ON DISCHARGE:  The patient had a hemoglobin of 10.4, white blood  cell count of 17.9, and platelets of 185,000.      Leilani Able, P.A.-C.      Randye Lobo, M.D.  Electronically Signed    MB/MEDQ  D:  03/12/2008  T:  03/13/2008  Job:  [id number]

## 2010-12-03 LAB — CBC
HCT: 31.3 % — ABNORMAL LOW (ref 36.0–46.0)
HCT: 31.4 % — ABNORMAL LOW (ref 36.0–46.0)
Hemoglobin: 10.2 g/dL — ABNORMAL LOW (ref 12.0–15.0)
Hemoglobin: 10.6 g/dL — ABNORMAL LOW (ref 12.0–15.0)
MCHC: 33.3 g/dL (ref 30.0–36.0)
MCHC: 33.7 g/dL (ref 30.0–36.0)
MCV: 100.3 fL — ABNORMAL HIGH (ref 78.0–100.0)
Platelets: 268 10*3/uL (ref 150–400)
RBC: 3.12 MIL/uL — ABNORMAL LOW (ref 3.87–5.11)
RDW: 14.3 % (ref 11.5–15.5)
RDW: 14.5 % (ref 11.5–15.5)
WBC: 9.5 10*3/uL (ref 4.0–10.5)

## 2010-12-03 LAB — BASIC METABOLIC PANEL
BUN: 10 mg/dL (ref 6–23)
BUN: 13 mg/dL (ref 6–23)
CO2: 25 mEq/L (ref 19–32)
CO2: 26 mEq/L (ref 19–32)
CO2: 27 mEq/L (ref 19–32)
Calcium: 8.3 mg/dL — ABNORMAL LOW (ref 8.4–10.5)
Calcium: 8.5 mg/dL (ref 8.4–10.5)
Chloride: 105 mEq/L (ref 96–112)
Chloride: 107 mEq/L (ref 96–112)
Chloride: 109 mEq/L (ref 96–112)
Creatinine, Ser: 0.64 mg/dL (ref 0.4–1.2)
Creatinine, Ser: 0.81 mg/dL (ref 0.4–1.2)
GFR calc Af Amer: >60 mL/min (ref >60)
GFR calc non Af Amer: >60 mL/min (ref >60)
Glucose, Bld: 81 mg/dL (ref 70–99)
Potassium: 3.6 mEq/L (ref 3.5–5.1)
Potassium: 4.2 mEq/L (ref 3.5–5.1)
Sodium: 136 mEq/L (ref 135–145)
Sodium: 138 mEq/L (ref 135–145)
Sodium: 139 mEq/L (ref 135–145)

## 2010-12-03 LAB — RETICULOCYTES
RBC.: 3.47 MIL/uL — ABNORMAL LOW (ref 3.87–5.11)
Retic Count, Absolute: 111 10*3/uL (ref 19.0–186.0)

## 2010-12-03 LAB — FERRITIN: Ferritin: 29 ng/mL (ref 10–291)

## 2010-12-03 LAB — MAGNESIUM: Magnesium: 1.9 mg/dL (ref 1.5–2.5)

## 2010-12-03 LAB — POCT CARDIAC MARKERS

## 2010-12-03 LAB — DIFFERENTIAL
Basophils Absolute: 0 10*3/uL (ref 0.0–0.1)
Basophils Relative: 0 % (ref 0–1)
Eosinophils Absolute: 0.1 10*3/uL (ref 0.0–0.7)

## 2010-12-03 LAB — FOLATE: Folate: 16.3 ng/mL

## 2010-12-04 LAB — CBC
HCT: 29.9 % — ABNORMAL LOW (ref 36.0–46.0)
Hemoglobin: 10.4 g/dL — ABNORMAL LOW (ref 12.0–15.0)
MCV: 99.9 fL (ref 78.0–100.0)
Platelets: 185 10*3/uL (ref 150–400)
Platelets: 203 10*3/uL (ref 150–400)
RDW: 13.8 % (ref 11.5–15.5)
RDW: 13.9 % (ref 11.5–15.5)

## 2010-12-04 LAB — URINALYSIS, ROUTINE W REFLEX MICROSCOPIC
Ketones, ur: 15 mg/dL — AB
Specific Gravity, Urine: >1.03 — ABNORMAL HIGH (ref 1.005–1.030)
Urobilinogen, UA: 0.2 mg/dL (ref 0.0–1.0)
pH: 5.5 (ref 5.0–8.0)

## 2010-12-04 LAB — URINE MICROSCOPIC-ADD ON

## 2011-01-05 ENCOUNTER — Ambulatory Visit (INDEPENDENT_AMBULATORY_CARE_PROVIDER_SITE_OTHER): Payer: BC Managed Care – PPO | Admitting: Family Medicine

## 2011-01-05 ENCOUNTER — Encounter: Payer: Self-pay | Admitting: Family Medicine

## 2011-01-05 DIAGNOSIS — F329 Major depressive disorder, single episode, unspecified: Secondary | ICD-10-CM | POA: Insufficient documentation

## 2011-01-05 DIAGNOSIS — F341 Dysthymic disorder: Secondary | ICD-10-CM

## 2011-01-05 DIAGNOSIS — F32A Depression, unspecified: Secondary | ICD-10-CM

## 2011-01-05 DIAGNOSIS — M79609 Pain in unspecified limb: Secondary | ICD-10-CM

## 2011-01-05 DIAGNOSIS — M79603 Pain in arm, unspecified: Secondary | ICD-10-CM | POA: Insufficient documentation

## 2011-01-05 DIAGNOSIS — F419 Anxiety disorder, unspecified: Secondary | ICD-10-CM

## 2011-01-05 MED ORDER — CITALOPRAM HYDROBROMIDE 20 MG PO TABS: 20.0000 mg | ORAL_TABLET | Freq: Every day | ORAL | Status: AC

## 2011-01-05 MED ORDER — ALPRAZOLAM 0.5 MG PO TABS: 0.5000 mg | ORAL_TABLET | Freq: Three times a day (TID) | ORAL | Status: AC | PRN

## 2011-01-05 MED ORDER — NAPROXEN 500 MG PO TABS: 500.0000 mg | ORAL_TABLET | Freq: Two times a day (BID) | ORAL | Status: AC

## 2011-01-05 MED ORDER — CYCLOBENZAPRINE HCL 10 MG PO TABS: 10.0000 mg | ORAL_TABLET | Freq: Three times a day (TID) | ORAL | Status: AC | PRN

## 2011-01-05 NOTE — Patient Instructions (Signed)
Follow up in 1 month to recheck mood Start the Celexa daily Use the Alprazolam as needed for the panic moments Take the Flexeril at night to help w/ sleep and muscle spasm Start the Naproxen twice daily (w/ food) for the arm pain Someone will call you with your Ortho appt Call with any questions or concerns Hang in there!!!

## 2011-01-05 NOTE — Progress Notes (Signed)
  Subjective:    Patient ID: Glenda Hudson, female    DOB: Jun 05, 1965, 45 y.o.   MRN: [id number]  HPI Arm pain- R arm, broke elbow a few years ago.  Has had pain intermittently since.  Arm will go numb nightly, multiple times.  Mostly forearm numbness w/ some radiation into upper arm.  Reports hx of limited mobility s/p fx.  Anxiety/Depression- 'i'm an emotional mess'  5 months ago started new job.  Is having to leave work by 11am daily, crying frequently. 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  'overwhelmed, every day'.  'i'm losing my mind'.  Not sleeping well.  No thoughts of hurting self.  Is asking for time off work.   Review of Systems For ROS see HPI     Objective:   Physical Exam  Vitals reviewed. Constitutional: She appears well-developed and well-nourished.       Tearful, very emotional  Musculoskeletal:       Right elbow: She exhibits decreased range of motion. She exhibits no swelling, no effusion and no deformity. tenderness found. Radial head and olecranon process tenderness noted.  Psychiatric:       Anxious, tearful, apologetic for her emotions          Assessment & Plan:

## 2011-01-05 NOTE — Assessment & Plan Note (Signed)
New but likely a sequelae of old injury.  Given limited motion and numbness will refer to ortho for complete evaluation and tx.  Will start NSAIDs for pain and inflammation and flexeril to reduce possible muscle spasm.  Reviewed supportive care and red flags that should prompt return. Pt expressed understanding and is in agreement w/ plan.

## 2011-01-05 NOTE — Assessment & Plan Note (Signed)
New.  Pt feels she is at a breaking point and doesn't want to lose her job.  Will start daily controller med and add xanax prn for panicked moments.  Encouraged her to talk to a counselor- has one.  Counselor advised time off work.  Will give pt a note for the rest of the week.  Will follow closely.

## 2011-01-08 ENCOUNTER — Telehealth: Payer: Self-pay | Admitting: Family Medicine

## 2011-01-08 NOTE — Telephone Encounter (Signed)
Note was given for emotional distress- not her arm.  Unable to write her out of work at this time b/c as we discussed at OV, it is not a good idea for someone who is depressed to sit at home.  If arm pain is that severe she should go to the orthopedic UC at [facility] (no appt needed) and if her emotional distress is that severe she will need to establish w/ a psychiatrist

## 2011-01-08 NOTE — Telephone Encounter (Signed)
Patient was seen this week for arm pain & back pain - she was given note that she could  return to work Monday [id number] - she said she would like to have that extended because her arm goes numb during the day - she said appt with ortho is [id number]

## 2011-01-08 NOTE — Telephone Encounter (Signed)
Left message to call office

## 2011-01-08 NOTE — Telephone Encounter (Signed)
Please advise 

## 2011-01-11 ENCOUNTER — Encounter: Payer: Self-pay | Admitting: *Deleted

## 2011-01-11 NOTE — Telephone Encounter (Signed)
Discuss with patient in office, note given for work per Dr Beverely Low request.

## 2011-01-11 NOTE — Telephone Encounter (Signed)
Pt states that arm was discuss at OV and that is why dr Beverely Low referred her to ortho. 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 Please advise

## 2011-01-11 NOTE — Telephone Encounter (Signed)
Yes, arm pain was discussed at OV but this was not the reason for writing pt out of work.  i cannot write her out of work until her ortho appt in mid-December.  Again, if her arm pain is enough that she feels she needs to be out of work, she needs to go to Ortho UC at [facility]- no appt needed starting at 5:30pm.  She initially injured the arm 8 yrs ago and has been working since.  It is unlikely that she will do more damage but if she is concerned about her ability to do her job she must go have this checked.  Ortho will determine how much time (if any) is required off work

## 2011-04-23 ENCOUNTER — Other Ambulatory Visit: Payer: Self-pay | Admitting: Orthopedic Surgery

## 2011-04-23 DIAGNOSIS — M5410 Radiculopathy, site unspecified: Secondary | ICD-10-CM

## 2011-05-10 ENCOUNTER — Inpatient Hospital Stay: Admission: RE | Admit: 2011-05-10 | Payer: BC Managed Care – PPO | Source: Ambulatory Visit

## 2011-05-17 ENCOUNTER — Ambulatory Visit
Admission: RE | Admit: 2011-05-17 | Discharge: 2011-05-17 | Disposition: A | Discharge status: Home / Self Care | Payer: BC Managed Care – PPO | Source: Ambulatory Visit | Attending: Orthopedic Surgery | Admitting: Orthopedic Surgery

## 2011-05-17 DIAGNOSIS — M5410 Radiculopathy, site unspecified: Secondary | ICD-10-CM

## 2011-11-23 ENCOUNTER — Emergency Department (HOSPITAL_BASED_OUTPATIENT_CLINIC_OR_DEPARTMENT_OTHER)
Admission: EM | Admit: 2011-11-23 | Discharge: 2011-11-23 | Disposition: A | Discharge status: Home / Self Care | Payer: Medicaid Other | Attending: Emergency Medicine | Admitting: Emergency Medicine

## 2011-11-23 DIAGNOSIS — Z881 Allergy status to other antibiotic agents status: Secondary | ICD-10-CM | POA: Insufficient documentation

## 2011-11-23 DIAGNOSIS — S139XXA Sprain of joints and ligaments of unspecified parts of neck, initial encounter: Secondary | ICD-10-CM | POA: Insufficient documentation

## 2011-11-23 DIAGNOSIS — Z825 Family history of asthma and other chronic lower respiratory diseases: Secondary | ICD-10-CM | POA: Insufficient documentation

## 2011-11-23 DIAGNOSIS — M79609 Pain in unspecified limb: Secondary | ICD-10-CM | POA: Insufficient documentation

## 2011-11-23 DIAGNOSIS — X58XXXA Exposure to other specified factors, initial encounter: Secondary | ICD-10-CM | POA: Insufficient documentation

## 2011-11-23 DIAGNOSIS — S161XXA Strain of muscle, fascia and tendon at neck level, initial encounter: Secondary | ICD-10-CM

## 2011-11-23 DIAGNOSIS — Z8489 Family history of other specified conditions: Secondary | ICD-10-CM | POA: Insufficient documentation

## 2011-11-23 DIAGNOSIS — M79643 Pain in unspecified hand: Secondary | ICD-10-CM

## 2011-11-23 MED ORDER — IBUPROFEN 600 MG PO TABS: 600.0000 mg | ORAL_TABLET | Freq: Four times a day (QID) | ORAL | Status: AC | PRN

## 2011-11-23 MED ORDER — CYCLOBENZAPRINE HCL 5 MG PO TABS: 5.0000 mg | ORAL_TABLET | Freq: Three times a day (TID) | ORAL | Status: DC | PRN

## 2011-11-23 MED ORDER — IBUPROFEN 800 MG PO TABS
800.0000 mg | ORAL_TABLET | Freq: Once | ORAL | Status: AC
  Administered 2011-11-23: 800 mg via ORAL
  Filled 2011-11-23: qty 1

## 2011-11-23 NOTE — ED Notes (Signed)
States hit hand on chair on Friday- c/o of pain to rt hand C/o of neck pain x 1 week- no injury

## 2011-11-23 NOTE — ED Provider Notes (Signed)
History     CSN: [id number]  Arrival date & time 11/23/11  0919   First MD Initiated Contact with Patient 11/23/11 1057      Chief Complaint  Patient presents with  . Hand Pain  . Torticollis    (Consider location/radiation/quality/duration/timing/severity/associated sxs/prior treatment) HPI Pt presents with c/o pain in right hand as well as pain in left side of neck.  Hand pain began approx 3 days ago when she hit her hand on a chair.  She states pain has continued which concerned her.  She took ibuprofen - last dose yesterday.  No swelling or bruising of hand.  Also c/o left sided neck pain which is present only when she turns her head to the left hand side.  Pain has been present approx 1 week.  No trauma or injury prior to pain.  No fever, no neck stiffness.  No weakness or numbness in arms or hands.  Palpation and movement makes pain worse.  There are no other associated systemic symptoms, there are no other alleviating or modifying factors.   Past Medical History  Diagnosis Date  . Allergy     Past Surgical History  Procedure Date  . Tubal ligation     Family History  Problem Relation Age of Onset  . Asthma Mother   . Asthma Brother   . Emphysema Maternal Grandfather     History  Substance Use Topics  . Smoking status: Never Smoker   . Smokeless tobacco: Not on file  . Alcohol Use: No    OB History    Grav Para Term Preterm Abortions TAB SAB Ect Mult Living                  Review of Systems ROS reviewed and all otherwise negative except for mentioned in HPI  Allergies  Amoxicillin  Home Medications   Current Outpatient Rx  Name Route Sig Dispense Refill  . ALPRAZOLAM 0.5 MG PO TABS Oral Take 1 tablet (0.5 mg total) by mouth 3 (three) times daily as needed for sleep or anxiety. 30 tablet 0  . CITALOPRAM HYDROBROMIDE 20 MG PO TABS Oral Take 1 tablet (20 mg total) by mouth daily. 30 tablet 2  . CYCLOBENZAPRINE HCL 10 MG PO TABS Oral Take 1 tablet (10  mg total) by mouth every 8 (eight) hours as needed for muscle spasms. 30 tablet 1  . CYCLOBENZAPRINE HCL 5 MG PO TABS Oral Take 1 tablet (5 mg total) by mouth 3 (three) times daily as needed for muscle spasms. 20 tablet 0  . IBUPROFEN 600 MG PO TABS Oral Take 1 tablet (600 mg total) by mouth every 6 (six) hours as needed for pain. 30 tablet 0  . NAPROXEN 500 MG PO TABS Oral Take 1 tablet (500 mg total) by mouth 2 (two) times daily with a meal. 60 tablet 2    BP 128/79  Pulse 79  Temp 98.2 F (36.8 C) (Oral)  Resp 18  SpO2 100%  LMP 11/09/2011 Vitals reviewed Physical Exam Physical Examination: General appearance - alert, well appearing, and in no distress Mental status - alert, oriented to person, place, and time Eyes - pupils equal and reactive, extraocular eye movements intact Mouth - mucous membranes moist, pharynx normal without lesions Neck - supple, no significant adenopathy, no midline cspine tenderness, mild ttp over left SCM distribution, pt with FROM of neck with mild pain on turning head to left Chest - clear to auscultation, no wheezes, rales or rhonchi,  symmetric air entry Heart - normal rate, regular rhythm, normal S1, S2, no murmurs, rubs, clicks or gallops Abdomen - soft, nontender, nondistended, no masses or organomegaly Neurological - alert, oriented, normal speech, strength 5/5 in extremities x 4, sensation intact in upper extremities Musculoskeletal - no joint tenderness, deformity or swelling, no bony point tenderness over hand, pt points to 5th metacarpal, FROM of joint of hand, no bruising/no deformity, no anatomic snuffbox tenderness Extremities - peripheral pulses normal, no pedal edema, no clubbing or cyanosis Skin - normal coloration and turgor, no rashes  ED Course  Procedures (including critical care time)  Labs Reviewed - No data to display No results found.   1. Cervical muscle strain   2. Hand pain       MDM  Pt presents with c/o pain in right  hand- exam reassuring- no bony point tenderness, bruising, swelling or deformity.  I have low suspicion for bony injury and do not feel xrays are indicated at this time. Neck pain on left side- tenderness clearly overlying SCM on left and pain only with head turning.  No indication for xrays of neck at this time either.  D/w patient ibuprofen as well as adding flexeril for neck pain.  Discharged with strict return precautions.  Pt agreeable with plan.        Ethelda Chick, MD 11/23/11 1226

## 2012-03-23 ENCOUNTER — Encounter (HOSPITAL_BASED_OUTPATIENT_CLINIC_OR_DEPARTMENT_OTHER): Payer: Self-pay | Admitting: *Deleted

## 2012-03-23 ENCOUNTER — Emergency Department (HOSPITAL_BASED_OUTPATIENT_CLINIC_OR_DEPARTMENT_OTHER)
Admission: EM | Admit: 2012-03-23 | Discharge: 2012-03-23 | Disposition: A | Discharge status: Home / Self Care | Payer: Medicaid Other | Attending: Emergency Medicine | Admitting: Emergency Medicine

## 2012-03-23 DIAGNOSIS — J029 Acute pharyngitis, unspecified: Secondary | ICD-10-CM | POA: Insufficient documentation

## 2012-03-23 DIAGNOSIS — Z79899 Other long term (current) drug therapy: Secondary | ICD-10-CM | POA: Insufficient documentation

## 2012-03-23 DIAGNOSIS — H9209 Otalgia, unspecified ear: Secondary | ICD-10-CM | POA: Insufficient documentation

## 2012-03-23 DIAGNOSIS — J069 Acute upper respiratory infection, unspecified: Secondary | ICD-10-CM | POA: Insufficient documentation

## 2012-03-23 DIAGNOSIS — IMO0001 Reserved for inherently not codable concepts without codable children: Secondary | ICD-10-CM | POA: Insufficient documentation

## 2012-03-23 MED ORDER — BENZONATATE 100 MG PO CAPS: 100.0000 mg | ORAL_CAPSULE | Freq: Three times a day (TID) | ORAL | Status: DC

## 2012-03-23 MED ORDER — IBUPROFEN 600 MG PO TABS: 600.0000 mg | ORAL_TABLET | Freq: Four times a day (QID) | ORAL | Status: DC | PRN

## 2012-03-23 NOTE — ED Provider Notes (Signed)
History     CSN: [id number]  Arrival date & time 03/23/12  2031   First MD Initiated Contact with Patient 03/23/12 2112      Chief Complaint  Patient presents with  . URI    (Consider location/radiation/quality/duration/timing/severity/associated sxs/prior treatment) HPI Pt p/w 2 days of non-productive cough, sore throat, bl ear pressure, myalgias. No fever, CP, SOB, abd pain, N/V/D. Pt c/o pain and tightness of L SCM muscle. No nuchal rigidity. + sick contacts with URI symptoms.  Past Medical History  Diagnosis Date  . Allergy     Past Surgical History  Procedure Date  . Tubal ligation     Family History  Problem Relation Age of Onset  . Asthma Mother   . Asthma Brother   . Emphysema Maternal Grandfather     History  Substance Use Topics  . Smoking status: Never Smoker   . Smokeless tobacco: Not on file  . Alcohol Use: No    OB History    Grav Para Term Preterm Abortions TAB SAB Ect Mult Living                  Review of Systems  Constitutional: Negative for fever and chills.  HENT: Positive for ear pain, sore throat, rhinorrhea and neck pain. Negative for trouble swallowing, neck stiffness and sinus pressure.   Respiratory: Positive for cough. Negative for chest tightness, shortness of breath and wheezing.   Cardiovascular: Negative for chest pain.  Gastrointestinal: Negative for nausea, vomiting and abdominal pain.  Genitourinary: Negative for dysuria and flank pain.  Musculoskeletal: Positive for myalgias. Negative for arthralgias.  Skin: Negative for pallor and rash.  Neurological: Negative for dizziness, weakness, light-headedness, numbness and headaches.  All other systems reviewed and are negative.    Allergies  Amoxicillin  Home Medications   Current Outpatient Rx  Name  Route  Sig  Dispense  Refill  . BENZONATATE 100 MG PO CAPS   Oral   Take 1 capsule (100 mg total) by mouth every 8 (eight) hours.   21 capsule   0   . CITALOPRAM  HYDROBROMIDE 20 MG PO TABS   Oral   Take 1 tablet (20 mg total) by mouth daily.   30 tablet   2   . CYCLOBENZAPRINE HCL 5 MG PO TABS   Oral   Take 1 tablet (5 mg total) by mouth 3 (three) times daily as needed for muscle spasms.   20 tablet   0   . IBUPROFEN 600 MG PO TABS   Oral   Take 1 tablet (600 mg total) by mouth every 6 (six) hours as needed for pain.   30 tablet   0   . IBUPROFEN 600 MG PO TABS   Oral   Take 1 tablet (600 mg total) by mouth every 6 (six) hours as needed for pain.   30 tablet   0     BP 135/82  Pulse 89  Temp 98.8 F (37.1 C) (Oral)  Resp 16  Ht 5\' 3"  (1.6 m)  Wt 220 lb (99.791 kg)  BMI 38.97 kg/m2  SpO2 100%  LMP 03/01/2012  Physical Exam  Nursing note and vitals reviewed. Constitutional: She is oriented to person, place, and time. She appears well-developed and well-nourished. No distress.  HENT:  Head: Normocephalic and atraumatic.  Right Ear: External ear normal.  Left Ear: External ear normal.  Mouth/Throat: Oropharynx is clear and moist. No oropharyngeal exudate.  Eyes: EOM are normal. Pupils are  equal, round, and reactive to light.  Neck: Normal range of motion. Neck supple.       Mild ttp over L SCM. No meningismus   Cardiovascular: Normal rate and regular rhythm.   Pulmonary/Chest: Effort normal and breath sounds normal. No respiratory distress. She has no wheezes. She has no rales. She exhibits no tenderness.  Abdominal: Soft. Bowel sounds are normal. She exhibits no distension and no mass. There is no tenderness. There is no rebound and no guarding.  Musculoskeletal: Normal range of motion. She exhibits no edema and no tenderness.  Neurological: She is alert and oriented to person, place, and time.       5/5 motor in all ext, sensation intact.   Skin: Skin is warm and dry. No rash noted. No erythema.  Psychiatric: She has a normal mood and affect. Her behavior is normal.    ED Course  Procedures (including critical care  time)  Labs Reviewed - No data to display No results found.   1. Acute URI       MDM  Reassurance and symptom control. Return for worsening symptoms or concerns        Loren Racer, MD 03/23/12 2139

## 2012-03-23 NOTE — ED Notes (Signed)
Pt c/o uri symptoms x 2 days

## 2012-05-15 ENCOUNTER — Emergency Department (HOSPITAL_BASED_OUTPATIENT_CLINIC_OR_DEPARTMENT_OTHER): Payer: Medicaid Other

## 2012-05-15 ENCOUNTER — Encounter (HOSPITAL_BASED_OUTPATIENT_CLINIC_OR_DEPARTMENT_OTHER): Payer: Self-pay | Admitting: *Deleted

## 2012-05-15 ENCOUNTER — Emergency Department (HOSPITAL_BASED_OUTPATIENT_CLINIC_OR_DEPARTMENT_OTHER)
Admission: EM | Admit: 2012-05-15 | Discharge: 2012-05-15 | Disposition: A | Discharge status: Home / Self Care | Payer: Medicaid Other | Attending: Emergency Medicine | Admitting: Emergency Medicine

## 2012-05-15 DIAGNOSIS — W19XXXA Unspecified fall, initial encounter: Secondary | ICD-10-CM

## 2012-05-15 DIAGNOSIS — Y92009 Unspecified place in unspecified non-institutional (private) residence as the place of occurrence of the external cause: Secondary | ICD-10-CM

## 2012-05-15 DIAGNOSIS — Y929 Unspecified place or not applicable: Secondary | ICD-10-CM | POA: Insufficient documentation

## 2012-05-15 DIAGNOSIS — Y9301 Activity, walking, marching and hiking: Secondary | ICD-10-CM | POA: Insufficient documentation

## 2012-05-15 DIAGNOSIS — R209 Unspecified disturbances of skin sensation: Secondary | ICD-10-CM | POA: Insufficient documentation

## 2012-05-15 DIAGNOSIS — M5430 Sciatica, unspecified side: Secondary | ICD-10-CM | POA: Insufficient documentation

## 2012-05-15 DIAGNOSIS — S300XXA Contusion of lower back and pelvis, initial encounter: Secondary | ICD-10-CM | POA: Insufficient documentation

## 2012-05-15 DIAGNOSIS — M5431 Sciatica, right side: Secondary | ICD-10-CM

## 2012-05-15 DIAGNOSIS — Y939 Activity, unspecified: Secondary | ICD-10-CM | POA: Insufficient documentation

## 2012-05-15 DIAGNOSIS — W010XXA Fall on same level from slipping, tripping and stumbling without subsequent striking against object, initial encounter: Secondary | ICD-10-CM | POA: Insufficient documentation

## 2012-05-15 MED ORDER — OXYCODONE-ACETAMINOPHEN 5-325 MG PO TABS: 1.0000 | ORAL_TABLET | Freq: Four times a day (QID) | ORAL | Status: AC | PRN

## 2012-05-15 MED ORDER — IBUPROFEN 600 MG PO TABS: 600.0000 mg | ORAL_TABLET | Freq: Four times a day (QID) | ORAL | Status: DC | PRN

## 2012-05-15 NOTE — ED Notes (Signed)
Fall 3 days ago. C.o pain to her right hip and middle lower buttocks.

## 2012-05-15 NOTE — ED Provider Notes (Addendum)
History     CSN: [id number]  Arrival date & time 05/15/12  1032   First MD Initiated Contact with Patient 05/15/12 1102      Chief Complaint  Patient presents with  . Fall    (Consider location/radiation/quality/duration/timing/severity/associated sxs/prior treatment) Patient is a 47 y.o. female presenting with fall. The history is provided by the patient.  Fall Incident onset: 4 days ago. The fall occurred while walking (Was walking down her carotid steps and slipped and fell. She fell down 4 steps mostly on the right buttocks and hip). She landed on a hard floor. The point of impact was the right hip. The pain is present in the right hip (lower back and no having numbness and tingling in the right thigh). The pain is at a severity of 7/10. The pain is moderate. She was ambulatory at the scene. Associated symptoms include numbness and tingling. Pertinent negatives include no abdominal pain, no bowel incontinence, no nausea, no vomiting, no hematuria and no loss of consciousness. Associated symptoms comments: Lower back pain.  No weakness and still able to walk.. The symptoms are aggravated by activity. She has tried NSAIDs for the symptoms. The treatment provided mild relief.    Past Medical History  Diagnosis Date  . Allergy     Past Surgical History  Procedure Laterality Date  . Tubal ligation    . Tubal ligation      Family History  Problem Relation Age of Onset  . Asthma Mother   . Asthma Brother   . Emphysema Maternal Grandfather     History  Substance Use Topics  . Smoking status: Never Smoker   . Smokeless tobacco: Not on file  . Alcohol Use: No    OB History   Grav Para Term Preterm Abortions TAB SAB Ect Mult Living                  Review of Systems  Gastrointestinal: Negative for nausea, vomiting, abdominal pain and bowel incontinence.  Genitourinary: Negative for hematuria.  Neurological: Positive for tingling and numbness. Negative for loss of  consciousness.  All other systems reviewed and are negative.    Allergies  Amoxicillin  Home Medications   Current Outpatient Rx  Name  Route  Sig  Dispense  Refill  . benzonatate (TESSALON) 100 MG capsule   Oral   Take 1 capsule (100 mg total) by mouth every 8 (eight) hours.   21 capsule   0   . EXPIRED: citalopram (CELEXA) 20 MG tablet   Oral   Take 1 tablet (20 mg total) by mouth daily.   30 tablet   2   . cyclobenzaprine (FLEXERIL) 5 MG tablet   Oral   Take 1 tablet (5 mg total) by mouth 3 (three) times daily as needed for muscle spasms.   20 tablet   0   . ibuprofen (ADVIL,MOTRIN) 600 MG tablet   Oral   Take 1 tablet (600 mg total) by mouth every 6 (six) hours as needed for pain.   30 tablet   0   . ibuprofen (ADVIL,MOTRIN) 600 MG tablet   Oral   Take 1 tablet (600 mg total) by mouth every 6 (six) hours as needed for pain.   30 tablet   0     BP 117/78  Pulse 71  Temp(Src) 98.2 F (36.8 C) (Oral)  Resp 20  SpO2 100%  LMP 05/15/2012  Physical Exam  Nursing note and vitals reviewed. Constitutional: She is  oriented to person, place, and time. She appears well-developed and well-nourished. No distress.  HENT:  Head: Normocephalic and atraumatic.  Mouth/Throat: Oropharynx is clear and moist.  Eyes: Conjunctivae and EOM are normal. Pupils are equal, round, and reactive to light.  Neck: Normal range of motion. Neck supple.  Cardiovascular: Normal rate, regular rhythm and intact distal pulses.   No murmur heard. Pulmonary/Chest: Effort normal and breath sounds normal. No respiratory distress. She has no wheezes. She has no rales.  Abdominal: Soft. She exhibits no distension. There is no tenderness. There is no rebound and no guarding.  Musculoskeletal: Normal range of motion. She exhibits tenderness. She exhibits no edema.       Lumbar back: She exhibits tenderness, bony tenderness and spasm. She exhibits no deformity and normal pulse.       Back:        Hands: Neurological: She is alert and oriented to person, place, and time. She has normal strength.  Mild decreased sensation over the medial right thigh  Skin: Skin is warm and dry. No rash noted. No erythema.  Psychiatric: She has a normal mood and affect. Her behavior is normal.    ED Course  Procedures (including critical care time)  Labs Reviewed - No data to display Dg Lumbar Spine Complete  05/15/2012  *RADIOLOGY REPORT*  Clinical Data: Fall 3 days ago.  Back pain.  LUMBAR SPINE - COMPLETE 4+ VIEW  Comparison: None.  Findings: Vertebral body height and alignment are normal. Intervertebral disc space height is maintained.  No pars interarticularis defect is identified.  Paraspinous structures appear normal.  IMPRESSION: Negative exam.   Original Report Authenticated By: Holley Dexter, M.D.    Dg Hip Complete Right  05/15/2012  *RADIOLOGY REPORT*  Clinical Data: Fall, low back and right hip pain  RIGHT HIP - COMPLETE 2+ VIEW  Comparison: None.  Findings: Normal right hip alignment.  No displaced fracture or acute osseous finding.  Pelvis and hips appear symmetric.  Normal SI joints and no diastasis.  Pelvic calcifications consistent with venous phleboliths.  IMPRESSION: No acute osseous finding   Original Report Authenticated By: Judie Petit. Miles Costain, M.D.      1. Fall at home, initial encounter   2. Traumatic hematoma of buttock, initial encounter   3. Sciatica neuralgia, right       MDM   Patient with a mechanical fall 4 days ago down the steps landing on her right hip. Since that time she's had persistent right buttocks pain and is now having pain and numbness in the thigh. She is neurovascularly intact and has normal strength in her leg. Large healing ecchymosis over the right buttocks. Feel this is most likely sciatica from recent injury. Lumbar spine and right hip films are within normal limits. Will treat with ibuprofen and pain control. The patient was given a referral if pain does  not improve within the next week and a half.        Gwyneth Sprout, MD 05/15/12 1205  Gwyneth Sprout, MD 05/15/12 1207

## 2013-01-03 ENCOUNTER — Emergency Department (HOSPITAL_BASED_OUTPATIENT_CLINIC_OR_DEPARTMENT_OTHER)
Admission: EM | Admit: 2013-01-03 | Discharge: 2013-01-03 | Disposition: A | Discharge status: Home / Self Care | Payer: Medicaid Other | Attending: Emergency Medicine | Admitting: Emergency Medicine

## 2013-01-03 ENCOUNTER — Emergency Department (HOSPITAL_BASED_OUTPATIENT_CLINIC_OR_DEPARTMENT_OTHER): Payer: Medicaid Other

## 2013-01-03 ENCOUNTER — Encounter (HOSPITAL_BASED_OUTPATIENT_CLINIC_OR_DEPARTMENT_OTHER): Payer: Self-pay | Admitting: Emergency Medicine

## 2013-01-03 DIAGNOSIS — M25519 Pain in unspecified shoulder: Secondary | ICD-10-CM | POA: Insufficient documentation

## 2013-01-03 DIAGNOSIS — M25511 Pain in right shoulder: Secondary | ICD-10-CM

## 2013-01-03 DIAGNOSIS — Z79899 Other long term (current) drug therapy: Secondary | ICD-10-CM | POA: Insufficient documentation

## 2013-01-03 DIAGNOSIS — Z85038 Personal history of other malignant neoplasm of large intestine: Secondary | ICD-10-CM | POA: Insufficient documentation

## 2013-01-03 DIAGNOSIS — R52 Pain, unspecified: Secondary | ICD-10-CM | POA: Insufficient documentation

## 2013-01-03 HISTORY — DX: Malignant (primary) neoplasm, unspecified: C80.1

## 2013-01-03 HISTORY — DX: Malignant neoplasm of colon, unspecified: C18.9

## 2013-01-03 MED ORDER — IBUPROFEN 800 MG PO TABS
800.0000 mg | ORAL_TABLET | Freq: Once | ORAL | Status: AC
  Administered 2013-01-03: 800 mg via ORAL
  Filled 2013-01-03: qty 1

## 2013-01-03 MED ORDER — CYCLOBENZAPRINE HCL 10 MG PO TABS: 10.0000 mg | ORAL_TABLET | Freq: Three times a day (TID) | ORAL | Status: AC | PRN

## 2013-01-03 MED ORDER — IBUPROFEN 800 MG PO TABS: 800.0000 mg | ORAL_TABLET | Freq: Three times a day (TID) | ORAL | Status: DC

## 2013-01-03 MED ORDER — CYCLOBENZAPRINE HCL 10 MG PO TABS
10.0000 mg | ORAL_TABLET | Freq: Once | ORAL | Status: AC
  Administered 2013-01-03: 10 mg via ORAL
  Filled 2013-01-03: qty 1

## 2013-01-03 NOTE — ED Notes (Signed)
Pt reports that she had had left shoulder pain after receiving flu shot over 1 month ago, pain has increased, pain awakens patient

## 2013-01-03 NOTE — ED Notes (Signed)
Pain to right shoulder, denies numbness or tingling to arm or fingers

## 2013-01-03 NOTE — ED Provider Notes (Signed)
CSN: [id number]     Arrival date & time 01/03/13  1478 History   First MD Initiated Contact with Patient 01/03/13 [phone]     Chief Complaint  Patient presents with  . Shoulder Pain   (Consider location/radiation/quality/duration/timing/severity/associated sxs/prior Treatment) Patient is a 47 y.o. female presenting with shoulder pain. The history is provided by the patient.  Shoulder Pain This is a new problem. The current episode started more than 1 week ago. The problem occurs every several days. The problem has been gradually worsening. Pertinent negatives include no chest pain and no shortness of breath. Exacerbated by: movement of shoulder, palpation. Nothing relieves the symptoms.    Past Medical History  Diagnosis Date  . Allergy   . Cancer   . Colon cancer    Past Surgical History  Procedure Laterality Date  . Tubal ligation    . Tubal ligation     Family History  Problem Relation Age of Onset  . Asthma Mother   . Asthma Brother   . Emphysema Maternal Grandfather    History  Substance Use Topics  . Smoking status: Never Smoker   . Smokeless tobacco: Not on file  . Alcohol Use: No   OB History   Grav Para Term Preterm Abortions TAB SAB Ect Mult Living                 Review of Systems  Constitutional: Negative for fever.  Respiratory: Negative for cough and shortness of breath.   Cardiovascular: Negative for chest pain and leg swelling.  All other systems reviewed and are negative.    Allergies  Amoxicillin  Home Medications   Current Outpatient Rx  Name  Route  Sig  Dispense  Refill  . benzonatate (TESSALON) 100 MG capsule   Oral   Take 1 capsule (100 mg total) by mouth every 8 (eight) hours.   21 capsule   0   . EXPIRED: citalopram (CELEXA) 20 MG tablet   Oral   Take 1 tablet (20 mg total) by mouth daily.   30 tablet   2   . cyclobenzaprine (FLEXERIL) 5 MG tablet   Oral   Take 1 tablet (5 mg total) by mouth 3 (three) times daily as needed  for muscle spasms.   20 tablet   0   . ibuprofen (ADVIL,MOTRIN) 600 MG tablet   Oral   Take 1 tablet (600 mg total) by mouth every 6 (six) hours as needed for pain.   30 tablet   0   . ibuprofen (ADVIL,MOTRIN) 600 MG tablet   Oral   Take 1 tablet (600 mg total) by mouth every 6 (six) hours as needed for pain.   30 tablet   0   . ibuprofen (ADVIL,MOTRIN) 600 MG tablet   Oral   Take 1 tablet (600 mg total) by mouth every 6 (six) hours as needed for pain.   30 tablet   0   . oxyCODONE-acetaminophen (PERCOCET/ROXICET) 5-325 MG per tablet   Oral   Take 1 tablet by mouth every 6 (six) hours as needed for pain.   15 tablet   0    BP 117/79  Pulse 67  Resp 18  SpO2 100%  LMP 01/03/2013 Physical Exam  Nursing note and vitals reviewed. Constitutional: She is oriented to person, place, and time. She appears well-developed and well-nourished. No distress.  HENT:  Head: Normocephalic and atraumatic.  Eyes: EOM are normal. Pupils are equal, round, and reactive to light.  Neck: Normal range of motion. Neck supple.  Cardiovascular: Normal rate and regular rhythm.  Exam reveals no friction rub.   No murmur heard. Pulmonary/Chest: Effort normal and breath sounds normal. No respiratory distress. She has no wheezes. She has no rales.  Abdominal: Soft. She exhibits no distension. There is no tenderness. There is no rebound.  Musculoskeletal: She exhibits no edema.       Right shoulder: She exhibits decreased range of motion (cannot abduct greater than 90 degrees due to pain), bony tenderness (ltareal) and pain. She exhibits no tenderness, no crepitus, no deformity, no laceration, no spasm, normal pulse and normal strength.  Neurological: She is alert and oriented to person, place, and time.  Skin: She is not diaphoretic.    ED Course  Procedures (including critical care time) Labs Review Labs Reviewed - No data to display Imaging Review No results found.  EKG Interpretation    None       MDM   1. Shoulder pain, acute, right    28F with R shoulder pain. Began after her flu shot. Pain resolved, however started back again and is prevent sleep. Described as an ache. Worse with abducting shoulder past 90 degrees and palpation. Denies fevers, numbness/tingling in arm, N/V, trauma. AFVSS. R lateral bony tenderness, no muscular tenderness or spasm. Recent hx of colon cancer, will xray to look for possible bony metastasis, will treat with NSAIDs, flexeril. Has f/u with Dr. Leanor Kail. Xray normal. Stable for discharge.     Dagmar Hait, MD 01/03/13 [phone]

## 2013-02-19 ENCOUNTER — Encounter (HOSPITAL_BASED_OUTPATIENT_CLINIC_OR_DEPARTMENT_OTHER): Payer: Self-pay | Admitting: Emergency Medicine

## 2013-02-19 ENCOUNTER — Emergency Department (HOSPITAL_BASED_OUTPATIENT_CLINIC_OR_DEPARTMENT_OTHER)
Admission: EM | Admit: 2013-02-19 | Discharge: 2013-02-20 | Disposition: A | Discharge status: Home / Self Care | Payer: Medicaid Other | Attending: Emergency Medicine | Admitting: Emergency Medicine

## 2013-02-19 DIAGNOSIS — J069 Acute upper respiratory infection, unspecified: Secondary | ICD-10-CM

## 2013-02-19 DIAGNOSIS — Z880 Allergy status to penicillin: Secondary | ICD-10-CM | POA: Insufficient documentation

## 2013-02-19 DIAGNOSIS — Z79899 Other long term (current) drug therapy: Secondary | ICD-10-CM | POA: Insufficient documentation

## 2013-02-19 DIAGNOSIS — Z791 Long term (current) use of non-steroidal anti-inflammatories (NSAID): Secondary | ICD-10-CM | POA: Insufficient documentation

## 2013-02-19 DIAGNOSIS — Z85038 Personal history of other malignant neoplasm of large intestine: Secondary | ICD-10-CM | POA: Insufficient documentation

## 2013-02-19 MED ORDER — IBUPROFEN 800 MG PO TABS: 800.0000 mg | ORAL_TABLET | Freq: Three times a day (TID) | ORAL | Status: DC

## 2013-02-19 MED ORDER — IBUPROFEN 800 MG PO TABS
800.0000 mg | ORAL_TABLET | Freq: Once | ORAL | Status: AC
  Administered 2013-02-19: 800 mg via ORAL
  Filled 2013-02-19: qty 1

## 2013-02-19 MED ORDER — BENZONATATE 100 MG PO CAPS: 100.0000 mg | ORAL_CAPSULE | Freq: Three times a day (TID) | ORAL | Status: DC

## 2013-02-19 MED ORDER — LIDOCAINE VISCOUS 2 % MT SOLN
15.0000 mL | Freq: Once | OROMUCOSAL | Status: AC
  Administered 2013-02-19: 15 mL via OROMUCOSAL
  Filled 2013-02-19: qty 15

## 2013-02-19 NOTE — ED Notes (Signed)
Pt c/o sore throat and cough x 4 days

## 2013-02-19 NOTE — ED Provider Notes (Signed)
CSN: [id number]     Arrival date & time 02/19/13  2246 History   First MD Initiated Contact with Patient 02/19/13 2302  This chart was scribed for Kathaleen Dudziak Smitty Cords, MD by Valera Castle, ED Scribe. This patient was seen in room MH06/MH06 and the patient's care was started at 11:11 PM.    Chief Complaint  Patient presents with  . URI    Patient is a 47 y.o. female presenting with URI. The history is provided by the patient. No language interpreter was used.  URI Presenting symptoms: congestion and sore throat   Presenting symptoms: no fever   Congestion:    Location:  Nasal   Interferes with sleep: no     Interferes with eating/drinking: no   Sore throat:    Severity:  Moderate   Onset quality:  Gradual   Timing:  Constant   Progression:  Improving Severity:  Mild Onset quality:  Gradual Timing:  Constant Progression:  Improving Chronicity:  New Relieved by:  Nothing Worsened by:  Nothing tried Ineffective treatments:  None tried Associated symptoms: sneezing   Associated symptoms: no arthralgias, no neck pain and no wheezing   Risk factors: not elderly    HPI Comments: Glenda Hudson is a 47 y.o. female who presents to the Emergency Department complaining of sudden, moderate, constant sore throat, onset 4 days ago. She reports trying ginger, other herbal remedies without relief. She reports associated chest pain. She reports taking Allergy medicine, without relief. She denies Ibuprofen, and any other pain medication for the pain. She denies fever, and any other symptoms. She reports h/o colon cancer.   PCP - [name], MD  Past Medical History  Diagnosis Date  . Allergy   . Cancer   . Colon cancer    Past Surgical History  Procedure Laterality Date  . Tubal ligation    . Tubal ligation     Family History  Problem Relation Age of Onset  . Asthma Mother   . Asthma Brother   . Emphysema Maternal Grandfather    History  Substance Use Topics  . Smoking  status: Never Smoker   . Smokeless tobacco: Not on file  . Alcohol Use: No   OB History   Grav Para Term Preterm Abortions TAB SAB Ect Mult Living                 Review of Systems  Constitutional: Negative for fever.  HENT: Positive for congestion, sneezing and sore throat.   Respiratory: Negative for wheezing.   Cardiovascular: Negative for palpitations and leg swelling.  Musculoskeletal: Negative for arthralgias and neck pain.  All other systems reviewed and are negative.    Allergies  Amoxicillin  Home Medications   Current Outpatient Rx  Name  Route  Sig  Dispense  Refill  . benzonatate (TESSALON) 100 MG capsule   Oral   Take 1 capsule (100 mg total) by mouth every 8 (eight) hours.   21 capsule   0   . EXPIRED: citalopram (CELEXA) 20 MG tablet   Oral   Take 1 tablet (20 mg total) by mouth daily.   30 tablet   2   . cyclobenzaprine (FLEXERIL) 10 MG tablet   Oral   Take 1 tablet (10 mg total) by mouth 3 (three) times daily as needed for muscle spasms.   30 tablet   0   . cyclobenzaprine (FLEXERIL) 5 MG tablet   Oral   Take 1 tablet (5 mg  total) by mouth 3 (three) times daily as needed for muscle spasms.   20 tablet   0   . ibuprofen (ADVIL,MOTRIN) 600 MG tablet   Oral   Take 1 tablet (600 mg total) by mouth every 6 (six) hours as needed for pain.   30 tablet   0   . ibuprofen (ADVIL,MOTRIN) 600 MG tablet   Oral   Take 1 tablet (600 mg total) by mouth every 6 (six) hours as needed for pain.   30 tablet   0   . ibuprofen (ADVIL,MOTRIN) 600 MG tablet   Oral   Take 1 tablet (600 mg total) by mouth every 6 (six) hours as needed for pain.   30 tablet   0   . ibuprofen (ADVIL,MOTRIN) 800 MG tablet   Oral   Take 1 tablet (800 mg total) by mouth 3 (three) times daily.   30 tablet   0   . oxyCODONE-acetaminophen (PERCOCET/ROXICET) 5-325 MG per tablet   Oral   Take 1 tablet by mouth every 6 (six) hours as needed for pain.   15 tablet   0    BP  126/73  Pulse 87  Temp(Src) 98.8 F (37.1 C) (Oral)  Resp 16  Ht 5' 5.25" (1.657 m)  Wt 208 lb (94.348 kg)  BMI 34.36 kg/m2  SpO2 100%  LMP 01/23/2013  Physical Exam  Nursing note and vitals reviewed. Constitutional: She is oriented to person, place, and time. She appears well-developed and well-nourished. No distress.  HENT:  Head: Normocephalic and atraumatic.  Right Ear: Tympanic membrane, external ear and ear canal normal.  Left Ear: Tympanic membrane, external ear and ear canal normal.  Nose: Nose normal.  Mouth/Throat: Uvula is midline, oropharynx is clear and moist and mucous membranes are normal. No oropharyngeal exudate.  Eyes: Conjunctivae and EOM are normal. Pupils are equal, round, and reactive to light.  Neck: Normal range of motion. Neck supple. No tracheal deviation present.  Cardiovascular: Normal rate, regular rhythm and normal heart sounds.  Exam reveals no gallop and no friction rub.   No murmur heard. Pulmonary/Chest: Effort normal and breath sounds normal. No respiratory distress. She has no wheezes. She has no rales.  Abdominal: Soft. There is no tenderness. There is no rebound and no guarding.  Musculoskeletal: Normal range of motion.  Neurological: She is alert and oriented to person, place, and time.  Skin: Skin is warm and dry.  Psychiatric: She has a normal mood and affect. Her behavior is normal.    ED Course  Procedures (including critical care time)  DIAGNOSTIC STUDIES: Oxygen Saturation is 100% on room air, normal by my interpretation.    COORDINATION OF CARE: 11:18 PM-Discussed treatment plan which includes Ibuprofen with pt at bedside and pt agreed to plan. Will give pt prescription for decongestant.   11:41 PM - Discussed positive strep test results with pt and family.   Labs Review Labs Reviewed - No data to display Imaging Review No results found.  EKG Interpretation   None       MDM  No diagnosis found. Will treat URI  symptomatically  I personally performed the services described in this documentation, which was scribed in my presence. The recorded information has been reviewed and is accurate.     Jasmine Awe, MD 02/19/13 [phone]

## 2013-05-09 ENCOUNTER — Encounter (HOSPITAL_BASED_OUTPATIENT_CLINIC_OR_DEPARTMENT_OTHER): Payer: Self-pay | Admitting: Emergency Medicine

## 2013-05-09 ENCOUNTER — Emergency Department (HOSPITAL_BASED_OUTPATIENT_CLINIC_OR_DEPARTMENT_OTHER)
Admission: EM | Admit: 2013-05-09 | Discharge: 2013-05-09 | Disposition: A | Discharge status: Home / Self Care | Payer: Medicaid Other | Attending: Emergency Medicine | Admitting: Emergency Medicine

## 2013-05-09 DIAGNOSIS — Z79899 Other long term (current) drug therapy: Secondary | ICD-10-CM | POA: Insufficient documentation

## 2013-05-09 DIAGNOSIS — Z791 Long term (current) use of non-steroidal anti-inflammatories (NSAID): Secondary | ICD-10-CM | POA: Insufficient documentation

## 2013-05-09 DIAGNOSIS — J069 Acute upper respiratory infection, unspecified: Secondary | ICD-10-CM

## 2013-05-09 DIAGNOSIS — Z85038 Personal history of other malignant neoplasm of large intestine: Secondary | ICD-10-CM | POA: Insufficient documentation

## 2013-05-09 DIAGNOSIS — H938X9 Other specified disorders of ear, unspecified ear: Secondary | ICD-10-CM | POA: Insufficient documentation

## 2013-05-09 NOTE — ED Notes (Signed)
Pt c/o sorethroat in ear pain since last pm

## 2013-05-09 NOTE — ED Provider Notes (Signed)
Medical screening examination/treatment/procedure(s) were performed by non-physician practitioner and as supervising physician I was immediately available for consultation/collaboration.   EKG Interpretation None        Malvin Johns, MD 05/09/13 2117

## 2013-05-09 NOTE — ED Provider Notes (Signed)
CSN: [id number]     Arrival date & time 05/09/13  1949 History   First MD Initiated Contact with Patient 05/09/13 2005     Chief Complaint  Patient presents with  . URI     (Consider location/radiation/quality/duration/timing/severity/associated sxs/prior Treatment) HPI Comments: Sore throat and ear fullness that started last night. Hasn't tried any otc medications. No fever, cough,n/v/d. No nasal congestion  The history is provided by the patient. No language interpreter was used.    Past Medical History  Diagnosis Date  . Allergy   . Cancer   . Colon cancer    Past Surgical History  Procedure Laterality Date  . Tubal ligation    . Tubal ligation    . Colon surgery     Family History  Problem Relation Age of Onset  . Asthma Mother   . Asthma Brother   . Emphysema Maternal Grandfather    History  Substance Use Topics  . Smoking status: Never Smoker   . Smokeless tobacco: Not on file  . Alcohol Use: No   OB History   Grav Para Term Preterm Abortions TAB SAB Ect Mult Living                 Review of Systems  Constitutional: Negative.   HENT: Negative for congestion.   Respiratory: Negative for cough.   Cardiovascular: Negative.       Allergies  Amoxicillin  Home Medications   Current Outpatient Rx  Name  Route  Sig  Dispense  Refill  . cholecalciferol (VITAMIN D) 1000 UNITS tablet   Oral   Take 1,000 Units by mouth daily.         . nortriptyline (PAMELOR) 25 MG capsule   Oral   Take 25 mg by mouth at bedtime.         . benzonatate (TESSALON) 100 MG capsule   Oral   Take 1 capsule (100 mg total) by mouth every 8 (eight) hours.   21 capsule   0   . benzonatate (TESSALON) 100 MG capsule   Oral   Take 1 capsule (100 mg total) by mouth every 8 (eight) hours.   21 capsule   0   . EXPIRED: citalopram (CELEXA) 20 MG tablet   Oral   Take 1 tablet (20 mg total) by mouth daily.   30 tablet   2   . cyclobenzaprine (FLEXERIL) 10 MG tablet  Oral   Take 1 tablet (10 mg total) by mouth 3 (three) times daily as needed for muscle spasms.   30 tablet   0   . cyclobenzaprine (FLEXERIL) 5 MG tablet   Oral   Take 1 tablet (5 mg total) by mouth 3 (three) times daily as needed for muscle spasms.   20 tablet   0   . ibuprofen (ADVIL,MOTRIN) 600 MG tablet   Oral   Take 1 tablet (600 mg total) by mouth every 6 (six) hours as needed for pain.   30 tablet   0   . ibuprofen (ADVIL,MOTRIN) 600 MG tablet   Oral   Take 1 tablet (600 mg total) by mouth every 6 (six) hours as needed for pain.   30 tablet   0   . ibuprofen (ADVIL,MOTRIN) 600 MG tablet   Oral   Take 1 tablet (600 mg total) by mouth every 6 (six) hours as needed for pain.   30 tablet   0   . ibuprofen (ADVIL,MOTRIN) 800 MG tablet   Oral  Take 1 tablet (800 mg total) by mouth 3 (three) times daily.   30 tablet   0   . ibuprofen (ADVIL,MOTRIN) 800 MG tablet   Oral   Take 1 tablet (800 mg total) by mouth 3 (three) times daily.   21 tablet   0   . oxyCODONE-acetaminophen (PERCOCET/ROXICET) 5-325 MG per tablet   Oral   Take 1 tablet by mouth every 6 (six) hours as needed for pain.   15 tablet   0    BP 113/68  Pulse 77  Temp(Src) 97.6 F (36.4 C) (Oral)  Ht 5\' 3"  (1.6 m)  Wt 215 lb (97.523 kg)  BMI 38.09 kg/m2  SpO2 100%  LMP 04/29/2013 Physical Exam  Nursing note and vitals reviewed. Constitutional: She is oriented to person, place, and time.  HENT:  Right Ear: External ear normal.  Left Ear: External ear normal.  Mouth/Throat: Posterior oropharyngeal erythema present.  Eyes: Conjunctivae and EOM are normal.  Cardiovascular: Normal rate and regular rhythm.   Pulmonary/Chest: Effort normal and breath sounds normal.  Musculoskeletal: Normal range of motion.  Neurological: She is alert and oriented to person, place, and time.  Skin: Skin is warm and dry.  Psychiatric: She has a normal mood and affect.    ED Course  Procedures (including  critical care time) Labs Review Labs Reviewed - No data to display Imaging Review No results found.   EKG Interpretation None      MDM   Final diagnoses:  URI (upper respiratory infection)    Pt can take otc medications. No antibiotics needed at this time    Glendell Docker, NP 05/09/13 2101

## 2013-05-09 NOTE — Discharge Instructions (Signed)

## 2013-05-09 NOTE — ED Notes (Addendum)
sorethroat and ear pain onset last pm, pressure and pain increased w swallowing

## 2013-08-05 ENCOUNTER — Encounter (HOSPITAL_BASED_OUTPATIENT_CLINIC_OR_DEPARTMENT_OTHER): Payer: Self-pay | Admitting: Emergency Medicine

## 2013-08-05 ENCOUNTER — Emergency Department (HOSPITAL_BASED_OUTPATIENT_CLINIC_OR_DEPARTMENT_OTHER)
Admission: EM | Admit: 2013-08-05 | Discharge: 2013-08-05 | Disposition: A | Discharge status: Home / Self Care | Payer: Medicaid Other | Attending: Emergency Medicine | Admitting: Emergency Medicine

## 2013-08-05 DIAGNOSIS — Z85038 Personal history of other malignant neoplasm of large intestine: Secondary | ICD-10-CM | POA: Insufficient documentation

## 2013-08-05 DIAGNOSIS — Y939 Activity, unspecified: Secondary | ICD-10-CM | POA: Insufficient documentation

## 2013-08-05 DIAGNOSIS — W57XXXA Bitten or stung by nonvenomous insect and other nonvenomous arthropods, initial encounter: Principal | ICD-10-CM

## 2013-08-05 DIAGNOSIS — X58XXXA Exposure to other specified factors, initial encounter: Secondary | ICD-10-CM | POA: Insufficient documentation

## 2013-08-05 DIAGNOSIS — Z79899 Other long term (current) drug therapy: Secondary | ICD-10-CM | POA: Insufficient documentation

## 2013-08-05 DIAGNOSIS — Y929 Unspecified place or not applicable: Secondary | ICD-10-CM | POA: Insufficient documentation

## 2013-08-05 DIAGNOSIS — S70369A Insect bite (nonvenomous), unspecified thigh, initial encounter: Secondary | ICD-10-CM

## 2013-08-05 DIAGNOSIS — Z791 Long term (current) use of non-steroidal anti-inflammatories (NSAID): Secondary | ICD-10-CM | POA: Insufficient documentation

## 2013-08-05 DIAGNOSIS — S90569A Insect bite (nonvenomous), unspecified ankle, initial encounter: Secondary | ICD-10-CM | POA: Insufficient documentation

## 2013-08-05 DIAGNOSIS — Z880 Allergy status to penicillin: Secondary | ICD-10-CM | POA: Insufficient documentation

## 2013-08-05 MED ORDER — DOXYCYCLINE HYCLATE 100 MG PO TABS
200.0000 mg | ORAL_TABLET | Freq: Once | ORAL | Status: AC
  Administered 2013-08-05: 200 mg via ORAL
  Filled 2013-08-05: qty 2

## 2013-08-05 NOTE — ED Provider Notes (Signed)
CSN: [id number]     Arrival date & time 08/05/13  8185 History   First MD Initiated Contact with Patient 08/05/13 [phone]     Chief Complaint  Patient presents with  . Foreign Body in Skin     (Consider location/radiation/quality/duration/timing/severity/associated sxs/prior Treatment) HPI Comments: Pt comes in with foreign body. States that she noticed a bump in her left posterior thigh about 2 weeks ago. She thought it was one of her new moles. Her husband however saw some feet on it this AM, and advised her to come to the ER. Pt denies any rash, myalgias, arthralgias.   The history is provided by the patient.    Past Medical History  Diagnosis Date  . Allergy   . Cancer   . Colon cancer    Past Surgical History  Procedure Laterality Date  . Tubal ligation    . Tubal ligation    . Colon surgery     Family History  Problem Relation Age of Onset  . Asthma Mother   . Asthma Brother   . Emphysema Maternal Grandfather    History  Substance Use Topics  . Smoking status: Never Smoker   . Smokeless tobacco: Not on file  . Alcohol Use: No   OB History   Grav Para Term Preterm Abortions TAB SAB Ect Mult Living                 Review of Systems  Constitutional: Negative for fever and activity change.  Respiratory: Negative for shortness of breath.   Cardiovascular: Negative for chest pain.  Gastrointestinal: Negative for nausea, vomiting and abdominal pain.  Genitourinary: Negative for dysuria.  Musculoskeletal: Negative for arthralgias, myalgias and neck pain.  Skin: Negative for rash.  Neurological: Negative for headaches.      Allergies  Amoxicillin  Home Medications   Prior to Admission medications   Medication Sig Start Date End Date Taking? Authorizing Provider  benzonatate (TESSALON) 100 MG capsule Take 1 capsule (100 mg total) by mouth every 8 (eight) hours. 03/23/12   Julianne Rice, MD  benzonatate (TESSALON) 100 MG capsule Take 1 capsule (100 mg total)  by mouth every 8 (eight) hours. 02/19/13   April K Palumbo-Rasch, MD  cholecalciferol (VITAMIN D) 1000 UNITS tablet Take 1,000 Units by mouth daily.    Historical Provider, MD  citalopram (CELEXA) 20 MG tablet Take 1 tablet (20 mg total) by mouth daily. 01/05/11 01/05/12  Midge Minium, MD  cyclobenzaprine (FLEXERIL) 10 MG tablet Take 1 tablet (10 mg total) by mouth 3 (three) times daily as needed for muscle spasms. 01/03/13   Osvaldo Shipper, MD  cyclobenzaprine (FLEXERIL) 5 MG tablet Take 1 tablet (5 mg total) by mouth 3 (three) times daily as needed for muscle spasms. 11/23/11   Threasa Beards, MD  ibuprofen (ADVIL,MOTRIN) 600 MG tablet Take 1 tablet (600 mg total) by mouth every 6 (six) hours as needed for pain. 11/23/11   Threasa Beards, MD  ibuprofen (ADVIL,MOTRIN) 600 MG tablet Take 1 tablet (600 mg total) by mouth every 6 (six) hours as needed for pain. 03/23/12   Julianne Rice, MD  ibuprofen (ADVIL,MOTRIN) 600 MG tablet Take 1 tablet (600 mg total) by mouth every 6 (six) hours as needed for pain. 05/15/12   Blanchie Dessert, MD  ibuprofen (ADVIL,MOTRIN) 800 MG tablet Take 1 tablet (800 mg total) by mouth 3 (three) times daily. 01/03/13   Osvaldo Shipper, MD  ibuprofen (ADVIL,MOTRIN) 800 MG tablet Take  1 tablet (800 mg total) by mouth 3 (three) times daily. 02/19/13   April K Palumbo-Rasch, MD  nortriptyline (PAMELOR) 25 MG capsule Take 25 mg by mouth at bedtime.    Historical Provider, MD  oxyCODONE-acetaminophen (PERCOCET/ROXICET) 5-325 MG per tablet Take 1 tablet by mouth every 6 (six) hours as needed for pain. 05/15/12   Blanchie Dessert, MD   BP 150/90  Pulse 78  Temp(Src) 98.8 F (37.1 C) (Oral)  Resp 20  Ht 5\' 3"  (1.6 m)  Wt 209 lb (94.802 kg)  BMI 37.03 kg/m2  SpO2 100% Physical Exam  Nursing note and vitals reviewed. Constitutional: She is oriented to person, place, and time. She appears well-developed and well-nourished.  HENT:  Head: Normocephalic and  atraumatic.  Eyes: EOM are normal. Pupils are equal, round, and reactive to light.  Neck: Neck supple.  Cardiovascular: Normal rate, regular rhythm and normal heart sounds.   No murmur heard. Pulmonary/Chest: Effort normal. No respiratory distress.  Abdominal: Soft. She exhibits no distension. There is no tenderness. There is no rebound and no guarding.  Neurological: She is alert and oriented to person, place, and time.  Skin: Skin is warm and dry.  PLEASE SEE THE ATTACHED PICTURE TO SEE THE TICK.  Foreign body embedded in the posterior thigh, appears to be an insect.    Tick - in a cup  ED Course  FOREIGN BODY REMOVAL Date/Time: 08/05/2013 9:00 AM Performed by: Varney Biles Authorized by: Varney Biles Consent: Verbal consent obtained. Risks and benefits: risks, benefits and alternatives were discussed Consent given by: patient Required items: required blood products, implants, devices, and special equipment available Patient identity confirmed: verbally with patient Body area: skin General location: lower extremity Location details: left upper leg Complexity: simple 1 objects recovered. Objects recovered: Tick - in it's entirity Post-procedure assessment: foreign body removed   (including critical care time) Labs Review Labs Reviewed - No data to display  Imaging Review No results found.   EKG Interpretation None      MDM   Final diagnoses:  Tick bite of thigh    Pt with a tick bite. 200 mg oral doxy as a single dose prescribed as a prophylaxis against Lyme disease. Wound cleaned post tick extraction and wound care discussed.     Varney Biles, MD 08/05/13 [phone]

## 2013-08-05 NOTE — ED Notes (Signed)
Patient states she has a sore place on her L upper posterior thigh and is concerned it is a tick, tender

## 2013-08-05 NOTE — Discharge Instructions (Signed)
Ensure you are checking the area of tick bite for signs of infection - increased redness, pus drainage, increased pain. Cleanse daily with alcohol pad for the next 2-3 days.  READ THE INFORMATION ON THE TICK BITE BELOW:  Tick Bite Information Ticks are insects that attach themselves to the skin and draw blood for food. There are various types of ticks. Common types include wood ticks and deer ticks. Most ticks live in shrubs and grassy areas. Ticks can climb onto your body when you make contact with leaves or grass where the tick is waiting. The most common places on the body for ticks to attach themselves are the scalp, neck, armpits, waist, and groin. Most tick bites are harmless, but sometimes ticks carry germs that cause diseases. These germs can be spread to a person during the tick's feeding process. The chance of a disease spreading through a tick bite depends on:   The type of tick.  Time of year.   How long the tick is attached.   Geographic location.  HOW CAN YOU PREVENT TICK BITES? Take these steps to help prevent tick bites when you are outdoors:  Wear protective clothing. Long sleeves and long pants are best.   Wear white clothes so you can see ticks more easily.  Tuck your pant legs into your socks.   If walking on a trail, stay in the middle of the trail to avoid brushing against bushes.  Avoid walking through areas with long grass.  Put insect repellent on all exposed skin and along boot tops, pant legs, and sleeve cuffs.   Check clothing, hair, and skin repeatedly and before going inside.   Brush off any ticks that are not attached.  Take a shower or bath as soon as possible after being outdoors.  WHAT IS THE PROPER WAY TO REMOVE A TICK? Ticks should be removed as soon as possible to help prevent diseases caused by tick bites. 1. If latex gloves are available, put them on before trying to remove a tick.  2. Using fine-point tweezers, grasp the tick  as close to the skin as possible. You may also use curved forceps or a tick removal tool. Grasp the tick as close to its head as possible. Avoid grasping the tick on its body. 3. Pull gently with steady upward pressure until the tick lets go. Do not twist the tick or jerk it suddenly. This may break off the tick's head or mouth parts. 4. Do not squeeze or crush the tick's body. This could force disease-carrying fluids from the tick into your body.  5. After the tick is removed, wash the bite area and your hands with soap and water or other disinfectant such as alcohol. 6. Apply a small amount of antiseptic cream or ointment to the bite site.  7. Wash and disinfect any instruments that were used.  Do not try to remove a tick by applying a hot match, petroleum jelly, or fingernail polish to the tick. These methods do not work and may increase the chances of disease being spread from the tick bite.  WHEN SHOULD YOU SEEK MEDICAL CARE? Contact your health care provider if you are unable to remove a tick from your skin or if a part of the tick breaks off and is stuck in the skin.  After a tick bite, you need to be aware of signs and symptoms that could be related to diseases spread by ticks. Contact your health care provider if you develop any  of the following in the days or weeks after the tick bite:  Unexplained fever.  Rash. A circular rash that appears days or weeks after the tick bite may indicate the possibility of Lyme disease. The rash may resemble a target with a bull's-eye and may occur at a different part of your body than the tick bite.  Redness and swelling in the area of the tick bite.   Tender, swollen lymph glands.   Diarrhea.   Weight loss.   Cough.   Fatigue.   Muscle, joint, or bone pain.   Abdominal pain.   Headache.   Lethargy or a change in your level of consciousness.  Difficulty walking or moving your legs.   Numbness in the legs.    Paralysis.  Shortness of breath.   Confusion.   Repeated vomiting.  Document Released: 02/13/2000 Document Revised: 12/06/2012 Document Reviewed: 07/26/2012 [facility] Patient Information 2014 [place].

## 2013-11-02 ENCOUNTER — Encounter (HOSPITAL_BASED_OUTPATIENT_CLINIC_OR_DEPARTMENT_OTHER): Payer: Self-pay | Admitting: Emergency Medicine

## 2013-11-02 ENCOUNTER — Emergency Department (HOSPITAL_BASED_OUTPATIENT_CLINIC_OR_DEPARTMENT_OTHER)
Admission: EM | Admit: 2013-11-02 | Discharge: 2013-11-03 | Disposition: A | Discharge status: Home / Self Care | Payer: Medicaid Other | Attending: Emergency Medicine | Admitting: Emergency Medicine

## 2013-11-02 DIAGNOSIS — H109 Unspecified conjunctivitis: Secondary | ICD-10-CM

## 2013-11-02 DIAGNOSIS — H5789 Other specified disorders of eye and adnexa: Secondary | ICD-10-CM | POA: Insufficient documentation

## 2013-11-02 DIAGNOSIS — Z85038 Personal history of other malignant neoplasm of large intestine: Secondary | ICD-10-CM | POA: Insufficient documentation

## 2013-11-02 DIAGNOSIS — Z880 Allergy status to penicillin: Secondary | ICD-10-CM | POA: Insufficient documentation

## 2013-11-02 DIAGNOSIS — Z79899 Other long term (current) drug therapy: Secondary | ICD-10-CM | POA: Insufficient documentation

## 2013-11-02 DIAGNOSIS — Z791 Long term (current) use of non-steroidal anti-inflammatories (NSAID): Secondary | ICD-10-CM | POA: Insufficient documentation

## 2013-11-02 MED ORDER — ERYTHROMYCIN 5 MG/GM OP OINT
TOPICAL_OINTMENT | Freq: Four times a day (QID) | OPHTHALMIC | Status: DC
  Administered 2013-11-02: via OPHTHALMIC
  Filled 2013-11-02: qty 3.5

## 2013-11-02 NOTE — Discharge Instructions (Signed)

## 2013-11-02 NOTE — ED Notes (Signed)
Patient states that she has had dry eye in the pst, but today her left eye is draining and irritated. Is a visitor with another patient

## 2013-11-02 NOTE — ED Provider Notes (Signed)
CSN: [id number]     Arrival date & time 11/02/13  2303 History  This chart was scribed for Wynetta Fines, MD by Donato Schultz, ED Scribe. This patient was seen in room MH07/MH07 and the patient's care was started at 11:23 PM.    Chief Complaint  Patient presents with  . Eye Drainage    HPI HPI Comments: Glenda Hudson is a 48 y.o. female who presents to the Emergency Department complaining of constant dryness and foreign body sensation in her left eye that started 4 days ago.  She is experiencing similar, but milder symptoms in her right eye.  She has applied antihistamine eye drops with no relief to her symptoms.  She has not taken any other allergy medications for her symptoms.  When she looks to the left the vision in her left eye is "funky". She denies injury. There has been no drainage.  Past Medical History  Diagnosis Date  . Allergy   . Cancer   . Colon cancer    Past Surgical History  Procedure Laterality Date  . Tubal ligation    . Tubal ligation    . Colon surgery     Family History  Problem Relation Age of Onset  . Asthma Mother   . Asthma Brother   . Emphysema Maternal Grandfather    History  Substance Use Topics  . Smoking status: Never Smoker   . Smokeless tobacco: Not on file  . Alcohol Use: No   OB History   Grav Para Term Preterm Abortions TAB SAB Ect Mult Living                 Review of Systems  Eyes: Positive for visual disturbance.  All other systems reviewed and are negative.     Allergies  Amoxicillin  Home Medications   Prior to Admission medications   Medication Sig Start Date End Date Taking? Authorizing Provider  benzonatate (TESSALON) 100 MG capsule Take 1 capsule (100 mg total) by mouth every 8 (eight) hours. 03/23/12   Julianne Rice, MD  benzonatate (TESSALON) 100 MG capsule Take 1 capsule (100 mg total) by mouth every 8 (eight) hours. 02/19/13   April K Palumbo-Rasch, MD  cholecalciferol (VITAMIN D) 1000 UNITS tablet Take 1,000  Units by mouth daily.    Historical Provider, MD  citalopram (CELEXA) 20 MG tablet Take 1 tablet (20 mg total) by mouth daily. 01/05/11 01/05/12  Midge Minium, MD  cyclobenzaprine (FLEXERIL) 10 MG tablet Take 1 tablet (10 mg total) by mouth 3 (three) times daily as needed for muscle spasms. 01/03/13   Evelina Bucy, MD  cyclobenzaprine (FLEXERIL) 5 MG tablet Take 1 tablet (5 mg total) by mouth 3 (three) times daily as needed for muscle spasms. 11/23/11   Threasa Beards, MD  ibuprofen (ADVIL,MOTRIN) 600 MG tablet Take 1 tablet (600 mg total) by mouth every 6 (six) hours as needed for pain. 11/23/11   Threasa Beards, MD  ibuprofen (ADVIL,MOTRIN) 600 MG tablet Take 1 tablet (600 mg total) by mouth every 6 (six) hours as needed for pain. 03/23/12   Julianne Rice, MD  ibuprofen (ADVIL,MOTRIN) 600 MG tablet Take 1 tablet (600 mg total) by mouth every 6 (six) hours as needed for pain. 05/15/12   Blanchie Dessert, MD  ibuprofen (ADVIL,MOTRIN) 800 MG tablet Take 1 tablet (800 mg total) by mouth 3 (three) times daily. 01/03/13   Evelina Bucy, MD  ibuprofen (ADVIL,MOTRIN) 800 MG tablet Take 1 tablet (800 mg  total) by mouth 3 (three) times daily. 02/19/13   April K Palumbo-Rasch, MD  nortriptyline (PAMELOR) 25 MG capsule Take 25 mg by mouth at bedtime.    Historical Provider, MD  oxyCODONE-acetaminophen (PERCOCET/ROXICET) 5-325 MG per tablet Take 1 tablet by mouth every 6 (six) hours as needed for pain. 05/15/12   Blanchie Dessert, MD   BP 115/83  Pulse 65  Temp(Src) 97.9 F (36.6 C) (Oral)  Resp 18  Ht 5\' 3"  (1.6 m)  Wt 220 lb (99.791 kg)  BMI 38.98 kg/m2  SpO2 100%  LMP 10/16/2013 Physical Exam  Nursing note and vitals reviewed. Constitutional: She is oriented to person, place, and time. She appears well-developed and well-nourished.  HENT:  Head: Normocephalic and atraumatic.  Eyes: Conjunctivae and EOM are normal. Pupils are equal, round, and reactive to light. Right conjunctiva is not injected.  Left conjunctiva is not injected.  No hyphema.  No hypopyon.    Neck: Normal range of motion.  Cardiovascular: Normal rate.   Pulmonary/Chest: Effort normal.  Musculoskeletal: Normal range of motion.  Neurological: She is alert and oriented to person, place, and time.  Skin: Skin is warm and dry.  Psychiatric: She has a normal mood and affect. Her behavior is normal.    ED Course  Procedures (including critical care time)  DIAGNOSTIC STUDIES: Oxygen Saturation is 100% on room air, normal by my interpretation.    COORDINATION OF CARE: 11:26 PM- Discussed applying ointment in the patient's left eye and referring her to the ophthalmologist on call.  The patient agreed to the treatment plan.   MDM   Final diagnoses:  Bilateral conjunctivitis   I personally performed the services described in this documentation, which was scribed in my presence. The recorded information has been reviewed and is accurate.    Wynetta Fines, MD 11/02/13 [phone]

## 2014-11-13 ENCOUNTER — Encounter (HOSPITAL_BASED_OUTPATIENT_CLINIC_OR_DEPARTMENT_OTHER): Payer: Self-pay | Admitting: *Deleted

## 2014-11-13 ENCOUNTER — Emergency Department (HOSPITAL_BASED_OUTPATIENT_CLINIC_OR_DEPARTMENT_OTHER)
Admission: EM | Admit: 2014-11-13 | Discharge: 2014-11-13 | Disposition: A | Discharge status: Home / Self Care | Payer: Medicaid Other | Attending: Emergency Medicine | Admitting: Emergency Medicine

## 2014-11-13 ENCOUNTER — Emergency Department (HOSPITAL_BASED_OUTPATIENT_CLINIC_OR_DEPARTMENT_OTHER): Payer: Medicaid Other

## 2014-11-13 DIAGNOSIS — M549 Dorsalgia, unspecified: Secondary | ICD-10-CM

## 2014-11-13 DIAGNOSIS — S29092A Other injury of muscle and tendon of back wall of thorax, initial encounter: Secondary | ICD-10-CM | POA: Insufficient documentation

## 2014-11-13 DIAGNOSIS — S8392XA Sprain of unspecified site of left knee, initial encounter: Secondary | ICD-10-CM | POA: Insufficient documentation

## 2014-11-13 DIAGNOSIS — S93401A Sprain of unspecified ligament of right ankle, initial encounter: Secondary | ICD-10-CM | POA: Insufficient documentation

## 2014-11-13 DIAGNOSIS — Y998 Other external cause status: Secondary | ICD-10-CM | POA: Insufficient documentation

## 2014-11-13 DIAGNOSIS — W19XXXA Unspecified fall, initial encounter: Secondary | ICD-10-CM

## 2014-11-13 DIAGNOSIS — Z880 Allergy status to penicillin: Secondary | ICD-10-CM | POA: Insufficient documentation

## 2014-11-13 DIAGNOSIS — S20212A Contusion of left front wall of thorax, initial encounter: Secondary | ICD-10-CM | POA: Insufficient documentation

## 2014-11-13 DIAGNOSIS — Z79899 Other long term (current) drug therapy: Secondary | ICD-10-CM | POA: Insufficient documentation

## 2014-11-13 DIAGNOSIS — Z85038 Personal history of other malignant neoplasm of large intestine: Secondary | ICD-10-CM | POA: Insufficient documentation

## 2014-11-13 DIAGNOSIS — W010XXA Fall on same level from slipping, tripping and stumbling without subsequent striking against object, initial encounter: Secondary | ICD-10-CM | POA: Insufficient documentation

## 2014-11-13 DIAGNOSIS — Z791 Long term (current) use of non-steroidal anti-inflammatories (NSAID): Secondary | ICD-10-CM | POA: Insufficient documentation

## 2014-11-13 DIAGNOSIS — Y92481 Parking lot as the place of occurrence of the external cause: Secondary | ICD-10-CM | POA: Insufficient documentation

## 2014-11-13 DIAGNOSIS — Y9389 Activity, other specified: Secondary | ICD-10-CM | POA: Insufficient documentation

## 2014-11-13 DIAGNOSIS — S0990XA Unspecified injury of head, initial encounter: Secondary | ICD-10-CM | POA: Insufficient documentation

## 2014-11-13 NOTE — Discharge Instructions (Signed)
Ankle Sprain °An ankle sprain is an injury to the strong, fibrous tissues (ligaments) that hold the bones of your ankle joint together.  °CAUSES °An ankle sprain is usually caused by a fall or by twisting your ankle. Ankle sprains most commonly occur when you step on the outer edge of your foot, and your ankle turns inward. People who participate in sports are more prone to these types of injuries.  °SYMPTOMS  °· Pain in your ankle. The pain may be present at rest or only when you are trying to stand or walk. °· Swelling. °· Bruising. Bruising may develop immediately or within 1 to 2 days after your injury. °· Difficulty standing or walking, particularly when turning corners or changing directions. °DIAGNOSIS  °Your caregiver will ask you details about your injury and perform a physical exam of your ankle to determine if you have an ankle sprain. During the physical exam, your caregiver will press on and apply pressure to specific areas of your foot and ankle. Your caregiver will try to move your ankle in certain ways. An X-ray exam may be done to be sure a bone was not broken or a ligament did not separate from one of the bones in your ankle (avulsion fracture).  °TREATMENT  °Certain types of braces can help stabilize your ankle. Your caregiver can make a recommendation for this. Your caregiver may recommend the use of medicine for pain. If your sprain is severe, your caregiver may refer you to a surgeon who helps to restore function to parts of your skeletal system (orthopedist) or a physical therapist. °HOME CARE INSTRUCTIONS  °· Apply ice to your injury for 1-2 days or as directed by your caregiver. Applying ice helps to reduce inflammation and pain. °· Put ice in a plastic bag. °· Place a towel between your skin and the bag. °· Leave the ice on for 15-20 minutes at a time, every 2 hours while you are awake. °· Only take over-the-counter or prescription medicines for pain, discomfort, or fever as directed by  your caregiver. °· Elevate your injured ankle above the level of your heart as much as possible for 2-3 days. °· If your caregiver recommends crutches, use them as instructed. Gradually put weight on the affected ankle. Continue to use crutches or a cane until you can walk without feeling pain in your ankle. °· If you have a plaster splint, wear the splint as directed by your caregiver. Do not rest it on anything harder than a pillow for the first 24 hours. Do not put weight on it. Do not get it wet. You may take it off to take a shower or bath. °· You may have been given an elastic bandage to wear around your ankle to provide support. If the elastic bandage is too tight (you have numbness or tingling in your foot or your foot becomes cold and blue), adjust the bandage to make it comfortable. °· If you have an air splint, you may blow more air into it or let air out to make it more comfortable. You may take your splint off at night and before taking a shower or bath. Wiggle your toes in the splint several times per day to decrease swelling. °SEEK MEDICAL CARE IF:  °· You have rapidly increasing bruising or swelling. °· Your toes feel extremely cold or you lose feeling in your foot. °· Your pain is not relieved with medicine. °SEEK IMMEDIATE MEDICAL CARE IF: °· Your toes are numb or blue. °·   You have severe pain that is increasing. °MAKE SURE YOU:  °· Understand these instructions. °· Will watch your condition. °· Will get help right away if you are not doing well or get worse. °Document Released: 02/15/2005 Document Revised: 11/10/2011 Document Reviewed: 02/27/2011 °[employer]® Patient Information ©2015 [employer]. This information is not intended to replace advice given to you by your health care provider. Make sure you discuss any questions you have with your health care provider. ° °Joint Sprain °A sprain is a tear or stretch in the ligaments that hold a joint together. Severe sprains may need as long as 3-6  weeks of immobilization and/or exercises to heal completely. Sprained joints should be rested and protected. If not, they can become unstable and prone to re-injury. Proper treatment can reduce your pain, shorten the period of disability, and reduce the risk of repeated injuries. °TREATMENT  °· Rest and elevate the injured joint to reduce pain and swelling. °· Apply ice packs to the injury for 20-30 minutes every 2-3 hours for the next 2-3 days. °· Keep the injury wrapped in a compression bandage or splint as long as the joint is painful or as instructed by your caregiver. °· Do not use the injured joint until it is completely healed to prevent re-injury and chronic instability. Follow the instructions of your caregiver. °· Long-term sprain management may require exercises and/or treatment by a physical therapist. Taping or special braces may help stabilize the joint until it is completely better. °SEEK MEDICAL CARE IF:  °· You develop increased pain or swelling of the joint. °· You develop increasing redness and warmth of the joint. °· You develop a fever. °· It becomes stiff. °· Your hand or foot gets cold or numb. °Document Released: 03/25/2004 Document Revised: 05/10/2011 Document Reviewed: 03/04/2008 °[employer]® Patient Information ©2015 [employer]. This information is not intended to replace advice given to you by your health care provider. Make sure you discuss any questions you have with your health care provider. ° °

## 2014-11-13 NOTE — ED Notes (Signed)
Patient tripped and fell in a parking lot yesterday, pain in L knee, R ankle, L side upper rib pain and upper R side back pain. No LOC. Took ibuprofen last night no relief

## 2014-11-13 NOTE — ED Provider Notes (Signed)
CSN: [id number]     Arrival date & time 11/13/14  3086 History   First MD Initiated Contact with Patient 11/13/14 [phone]     Chief Complaint  Patient presents with  . Fall     (Consider location/radiation/quality/duration/timing/severity/associated sxs/prior Treatment) Patient is a 49 y.o. female presenting with fall. The history is provided by the patient.  Fall Associated symptoms include headaches. Pertinent negatives include no abdominal pain and no shortness of breath.   patient had a fall yesterday. States she stepped on some uneven pavement and fell. States she fell forward but ended up on her back somehow. Possible loss of consciousness. Since then she's had pain in her right ankle right knee back and left chest. Also has had a dull headache. No confusion. No numbness or weakness. No trouble breathing. No vision changes. Pain is dull and constant. Worse with movements. States she has had pinched disc in her back in the past.  Past Medical History  Diagnosis Date  . Allergy   . Cancer   . Colon cancer    Past Surgical History  Procedure Laterality Date  . Tubal ligation    . Tubal ligation    . Colon surgery     Family History  Problem Relation Age of Onset  . Asthma Mother   . Asthma Brother   . Emphysema Maternal Grandfather    Social History  Substance Use Topics  . Smoking status: Never Smoker   . Smokeless tobacco: None  . Alcohol Use: No   OB History    No data available     Review of Systems  Constitutional: Negative for appetite change.  HENT: Negative for trouble swallowing.   Eyes: Negative for pain.  Respiratory: Negative for shortness of breath.   Gastrointestinal: Negative for abdominal pain.  Genitourinary: Negative for flank pain.  Musculoskeletal: Positive for back pain. Negative for joint swelling and neck pain.  Skin: Negative for wound.  Neurological: Positive for headaches.      Allergies  Amoxicillin  Home Medications   Prior to  Admission medications   Medication Sig Start Date End Date Taking? Authorizing Provider  PHENTERMINE HCL PO Take by mouth.   Yes Historical Provider, MD  benzonatate (TESSALON) 100 MG capsule Take 1 capsule (100 mg total) by mouth every 8 (eight) hours. 03/23/12   Julianne Rice, MD  benzonatate (TESSALON) 100 MG capsule Take 1 capsule (100 mg total) by mouth every 8 (eight) hours. 02/19/13   April Palumbo, MD  cholecalciferol (VITAMIN D) 1000 UNITS tablet Take 1,000 Units by mouth daily.    Historical Provider, MD  citalopram (CELEXA) 20 MG tablet Take 1 tablet (20 mg total) by mouth daily. 01/05/11 01/05/12  Midge Minium, MD  cyclobenzaprine (FLEXERIL) 10 MG tablet Take 1 tablet (10 mg total) by mouth 3 (three) times daily as needed for muscle spasms. 01/03/13   Evelina Bucy, MD  cyclobenzaprine (FLEXERIL) 5 MG tablet Take 1 tablet (5 mg total) by mouth 3 (three) times daily as needed for muscle spasms. 11/23/11   Alfonzo Beers, MD  ibuprofen (ADVIL,MOTRIN) 600 MG tablet Take 1 tablet (600 mg total) by mouth every 6 (six) hours as needed for pain. 11/23/11   Alfonzo Beers, MD  ibuprofen (ADVIL,MOTRIN) 600 MG tablet Take 1 tablet (600 mg total) by mouth every 6 (six) hours as needed for pain. 03/23/12   Julianne Rice, MD  ibuprofen (ADVIL,MOTRIN) 600 MG tablet Take 1 tablet (600 mg total) by mouth every 6 (six)  hours as needed for pain. 05/15/12   Blanchie Dessert, MD  ibuprofen (ADVIL,MOTRIN) 800 MG tablet Take 1 tablet (800 mg total) by mouth 3 (three) times daily. 01/03/13   Evelina Bucy, MD  ibuprofen (ADVIL,MOTRIN) 800 MG tablet Take 1 tablet (800 mg total) by mouth 3 (three) times daily. 02/19/13   April Palumbo, MD  nortriptyline (PAMELOR) 25 MG capsule Take 25 mg by mouth at bedtime.    Historical Provider, MD  oxyCODONE-acetaminophen (PERCOCET/ROXICET) 5-325 MG per tablet Take 1 tablet by mouth every 6 (six) hours as needed for pain. 05/15/12   Blanchie Dessert, MD   BP 128/74 mmHg  Pulse  74  Temp(Src) 98.1 F (36.7 C) (Oral)  Resp 18  Ht 5' 3.25" (1.607 m)  Wt 228 lb (103.42 kg)  BMI 40.05 kg/m2  SpO2 100%  LMP 11/13/2014 Physical Exam  Constitutional: She appears well-developed and well-nourished.  HENT:  Head: Atraumatic.  Eyes: EOM are normal.  Neck: Normal range of motion. Neck supple.  Cardiovascular: Normal rate and regular rhythm.   Pulmonary/Chest: She exhibits tenderness.  Mild left posterior chest wall. No crepitance or deformity.  Abdominal: Soft. There is no tenderness.  Musculoskeletal: She exhibits tenderness.  Tenderness to right ankle laterally and posteriorly. Step-off or deformity. Mild tenderness to left knee medially. No tenderness over hips. Neurovascular intact over bilateral feet. Mild thoracic spine tenderness  Neurological: She is alert.  Skin: Skin is warm.  Psychiatric: She has a normal mood and affect.    ED Course  Procedures (including critical care time) Labs Review Labs Reviewed - No data to display  Imaging Review Dg Ribs Unilateral W/chest Left  11/13/2014   CLINICAL DATA:  Fall yesterday with left rib pain. Initial encounter.  EXAM: LEFT RIBS AND CHEST - 3+ VIEW  COMPARISON:  02/16/2008  FINDINGS: No fracture or other bone lesions are seen involving the ribs. There is no evidence of pneumothorax or pleural effusion. Both lungs are clear. Heart size and mediastinal contours are within normal limits.  IMPRESSION: Negative.   Electronically Signed   By: Monte Fantasia M.D.   On: 11/13/2014 10:35   Dg Ankle Complete Right  11/13/2014   CLINICAL DATA:  Fall yesterday with right ankle pain. Initial encounter.  EXAM: RIGHT ANKLE - COMPLETE 3+ VIEW  COMPARISON:  None.  FINDINGS: Soft tissue swelling without fracture, dislocation, or evidence of joint effusion.  Incidental heel spurs.  IMPRESSION: No acute osseous finding.   Electronically Signed   By: Monte Fantasia M.D.   On: 11/13/2014 10:36   Dg Knee Complete 4 Views  Left  11/13/2014   CLINICAL DATA:  Fall yesterday at restaurant, left rib pain, left knee pain  EXAM: LEFT KNEE - COMPLETE 4+ VIEW  COMPARISON:  None.  FINDINGS: Four views of the left knee submitted. No acute fracture or subluxation. No radiopaque foreign body. No joint effusion. Minimal spurring of patella.  IMPRESSION: Negative.   Electronically Signed   By: Lahoma Crocker M.D.   On: 11/13/2014 10:37   I have personally reviewed and evaluated these images and lab results as part of my medical decision-making.   EKG Interpretation None      MDM   Final diagnoses:  Fall, initial encounter  Ankle sprain, right, initial encounter  Knee sprain, left, initial encounter  Chest wall contusion, left, initial encounter  Bilateral back pain, unspecified location    Patient with fall. Negative x-rays. Doubt severe injury. Will discharge home. No Ultram due  to medication interactions.    Davonna Belling, MD 11/13/14 [phone]

## 2014-11-14 ENCOUNTER — Emergency Department (HOSPITAL_BASED_OUTPATIENT_CLINIC_OR_DEPARTMENT_OTHER)
Admission: EM | Admit: 2014-11-14 | Discharge: 2014-11-14 | Disposition: A | Discharge status: Home / Self Care | Payer: Medicaid Other | Attending: Emergency Medicine | Admitting: Emergency Medicine

## 2014-11-14 ENCOUNTER — Encounter (HOSPITAL_BASED_OUTPATIENT_CLINIC_OR_DEPARTMENT_OTHER): Payer: Self-pay

## 2014-11-14 DIAGNOSIS — M546 Pain in thoracic spine: Secondary | ICD-10-CM | POA: Insufficient documentation

## 2014-11-14 DIAGNOSIS — M2550 Pain in unspecified joint: Secondary | ICD-10-CM | POA: Insufficient documentation

## 2014-11-14 DIAGNOSIS — Z85038 Personal history of other malignant neoplasm of large intestine: Secondary | ICD-10-CM | POA: Insufficient documentation

## 2014-11-14 DIAGNOSIS — Z79899 Other long term (current) drug therapy: Secondary | ICD-10-CM | POA: Insufficient documentation

## 2014-11-14 DIAGNOSIS — Z880 Allergy status to penicillin: Secondary | ICD-10-CM | POA: Insufficient documentation

## 2014-11-14 DIAGNOSIS — R51 Headache: Secondary | ICD-10-CM | POA: Insufficient documentation

## 2014-11-14 DIAGNOSIS — R519 Headache, unspecified: Secondary | ICD-10-CM

## 2014-11-14 DIAGNOSIS — G8911 Acute pain due to trauma: Secondary | ICD-10-CM | POA: Insufficient documentation

## 2014-11-14 MED ORDER — NAPROXEN 250 MG PO TABS
500.0000 mg | ORAL_TABLET | Freq: Once | ORAL | Status: AC
  Administered 2014-11-14: 500 mg via ORAL
  Filled 2014-11-14: qty 2

## 2014-11-14 MED ORDER — METHOCARBAMOL 500 MG PO TABS: 500.0000 mg | ORAL_TABLET | Freq: Four times a day (QID) | ORAL | Status: AC

## 2014-11-14 NOTE — ED Notes (Signed)
Pt states she tripped on crack on Tuesday at [employer]-c/o cont'd pain to forehead and upper back pain-no LOC-pt with slow steady gait to triage-NAD

## 2014-11-14 NOTE — Discharge Instructions (Signed)
Please read and follow all provided instructions.  Your diagnoses today include:  1. Acute nonintractable headache, unspecified headache type   2. Bilateral thoracic back pain    Tests performed today include:  Vital signs. See below for your results today.   Medications prescribed:    Robaxin (methocarbamol) - muscle relaxer medication  DO NOT drive or perform any activities that require you to be awake and alert because this medicine can make you drowsy.   Take any prescribed medications only as directed.  Home care instructions:  Follow any educational materials contained in this packet. The worst pain and soreness will be 24-48 hours after the fall. Your symptoms should resolve steadily over several days at this time. Use warmth on affected areas as needed.   Follow-up instructions: Please follow-up with your primary care provider in 1 week for further evaluation of your symptoms if they are not completely improved.   Return instructions:   Please return to the Emergency Department if you experience worsening symptoms.   Please return if you experience increasing pain, vomiting, vision or hearing changes, confusion, numbness or tingling in your arms or legs, or if you feel it is necessary for any reason.   Please return if you have any other emergent concerns.  Additional Information:  Your vital signs today were: BP 110/69 mmHg   Pulse 70   Temp(Src) 98.4 F (36.9 C) (Oral)   Resp 16   Ht 5\' 3"  (1.6 m)   Wt 228 lb (103.42 kg)   BMI 40.40 kg/m2   SpO2 100%   LMP 11/13/2014 If your blood pressure (BP) was elevated above 135/85 this visit, please have this repeated by your doctor within one month. --------------

## 2014-11-14 NOTE — ED Provider Notes (Signed)
CSN: [id number]     Arrival date & time 11/14/14  1252 History   First MD Initiated Contact with Patient 11/14/14 1405     Chief Complaint  Patient presents with  . Fall     (Consider location/radiation/quality/duration/timing/severity/associated sxs/prior Treatment) HPI Comments: Patient with fall 2 days ago -- was seen in emergency department yesterday and had negative x-rays of left ribs, chest, right ankle, left knee. Patient states that she is continuing to have a frontal headache after the fall and pain in her upper back. She's been taking over-the-counter medications without relief. She is requesting an additional day off of work to help recover. She denies worsening headache, vomiting, difficulty with ambulation, weakness in her arms or legs. No vision change.  The history is provided by the patient.    Past Medical History  Diagnosis Date  . Allergy   . Cancer   . Colon cancer    Past Surgical History  Procedure Laterality Date  . Tubal ligation    . Tubal ligation    . Colon surgery     Family History  Problem Relation Age of Onset  . Asthma Mother   . Asthma Brother   . Emphysema Maternal Grandfather    Social History  Substance Use Topics  . Smoking status: Never Smoker   . Smokeless tobacco: None  . Alcohol Use: No   OB History    No data available     Review of Systems  Constitutional: Negative for fever.  HENT: Negative for congestion, dental problem, rhinorrhea and sinus pressure.   Eyes: Negative for photophobia, discharge, redness and visual disturbance.  Respiratory: Negative for shortness of breath.   Cardiovascular: Negative for chest pain.  Gastrointestinal: Negative for nausea and vomiting.  Musculoskeletal: Positive for myalgias, back pain and arthralgias. Negative for gait problem, neck pain and neck stiffness.  Skin: Negative for rash.  Neurological: Positive for headaches. Negative for syncope, speech difficulty, weakness,  light-headedness and numbness.  Psychiatric/Behavioral: Negative for confusion.      Allergies  Amoxicillin  Home Medications   Prior to Admission medications   Medication Sig Start Date End Date Taking? Authorizing Provider  benzonatate (TESSALON) 100 MG capsule Take 1 capsule (100 mg total) by mouth every 8 (eight) hours. 03/23/12   Julianne Rice, MD  benzonatate (TESSALON) 100 MG capsule Take 1 capsule (100 mg total) by mouth every 8 (eight) hours. 02/19/13   April Palumbo, MD  cholecalciferol (VITAMIN D) 1000 UNITS tablet Take 1,000 Units by mouth daily.    Historical Provider, MD  citalopram (CELEXA) 20 MG tablet Take 1 tablet (20 mg total) by mouth daily. 01/05/11 01/05/12  Midge Minium, MD  cyclobenzaprine (FLEXERIL) 10 MG tablet Take 1 tablet (10 mg total) by mouth 3 (three) times daily as needed for muscle spasms. 01/03/13   Evelina Bucy, MD  cyclobenzaprine (FLEXERIL) 5 MG tablet Take 1 tablet (5 mg total) by mouth 3 (three) times daily as needed for muscle spasms. 11/23/11   Alfonzo Beers, MD  ibuprofen (ADVIL,MOTRIN) 600 MG tablet Take 1 tablet (600 mg total) by mouth every 6 (six) hours as needed for pain. 11/23/11   Alfonzo Beers, MD  ibuprofen (ADVIL,MOTRIN) 600 MG tablet Take 1 tablet (600 mg total) by mouth every 6 (six) hours as needed for pain. 03/23/12   Julianne Rice, MD  ibuprofen (ADVIL,MOTRIN) 600 MG tablet Take 1 tablet (600 mg total) by mouth every 6 (six) hours as needed for pain. 05/15/12  Blanchie Dessert, MD  ibuprofen (ADVIL,MOTRIN) 800 MG tablet Take 1 tablet (800 mg total) by mouth 3 (three) times daily. 01/03/13   Evelina Bucy, MD  ibuprofen (ADVIL,MOTRIN) 800 MG tablet Take 1 tablet (800 mg total) by mouth 3 (three) times daily. 02/19/13   April Palumbo, MD  nortriptyline (PAMELOR) 25 MG capsule Take 25 mg by mouth at bedtime.    Historical Provider, MD  oxyCODONE-acetaminophen (PERCOCET/ROXICET) 5-325 MG per tablet Take 1 tablet by mouth every 6 (six)  hours as needed for pain. 05/15/12   Blanchie Dessert, MD  PHENTERMINE HCL PO Take by mouth.    Historical Provider, MD   BP 110/69 mmHg  Pulse 70  Temp(Src) 98.4 F (36.9 C) (Oral)  Resp 16  Ht 5\' 3"  (1.6 m)  Wt 228 lb (103.42 kg)  BMI 40.40 kg/m2  SpO2 100%  LMP 11/13/2014 Physical Exam  Constitutional: She is oriented to person, place, and time. She appears well-developed and well-nourished.  HENT:  Head: Normocephalic and atraumatic.  Right Ear: Tympanic membrane, external ear and ear canal normal.  Left Ear: Tympanic membrane, external ear and ear canal normal.  Nose: Nose normal.  Mouth/Throat: Uvula is midline, oropharynx is clear and moist and mucous membranes are normal.  Eyes: Conjunctivae, EOM and lids are normal. Pupils are equal, round, and reactive to light. Right eye exhibits no nystagmus. Left eye exhibits no nystagmus.  Neck: Normal range of motion. Neck supple.  Cardiovascular: Normal rate and regular rhythm.   Pulmonary/Chest: Effort normal and breath sounds normal.  Abdominal: Soft. There is no tenderness.  Musculoskeletal:       Cervical back: She exhibits normal range of motion, no tenderness and no bony tenderness.       Thoracic back: She exhibits tenderness. She exhibits normal range of motion and no bony tenderness.       Lumbar back: She exhibits normal range of motion, no tenderness and no bony tenderness.  Neurological: She is alert and oriented to person, place, and time. She has normal strength and normal reflexes. No cranial nerve deficit or sensory deficit. She displays a negative Romberg sign. Coordination and gait normal. GCS eye subscore is 4. GCS verbal subscore is 5. GCS motor subscore is 6.  Skin: Skin is warm and dry.  Psychiatric: She has a normal mood and affect.  Nursing note and vitals reviewed.   ED Course  Procedures (including critical care time) Labs Review Labs Reviewed - No data to display  Imaging Review Dg Ribs Unilateral  W/chest Left  11/13/2014   CLINICAL DATA:  Fall yesterday with left rib pain. Initial encounter.  EXAM: LEFT RIBS AND CHEST - 3+ VIEW  COMPARISON:  02/16/2008  FINDINGS: No fracture or other bone lesions are seen involving the ribs. There is no evidence of pneumothorax or pleural effusion. Both lungs are clear. Heart size and mediastinal contours are within normal limits.  IMPRESSION: Negative.   Electronically Signed   By: Monte Fantasia M.D.   On: 11/13/2014 10:35   Dg Ankle Complete Right  11/13/2014   CLINICAL DATA:  Fall yesterday with right ankle pain. Initial encounter.  EXAM: RIGHT ANKLE - COMPLETE 3+ VIEW  COMPARISON:  None.  FINDINGS: Soft tissue swelling without fracture, dislocation, or evidence of joint effusion.  Incidental heel spurs.  IMPRESSION: No acute osseous finding.   Electronically Signed   By: Monte Fantasia M.D.   On: 11/13/2014 10:36   Dg Knee Complete 4 Views Left  11/13/2014  CLINICAL DATA:  Fall yesterday at restaurant, left rib pain, left knee pain  EXAM: LEFT KNEE - COMPLETE 4+ VIEW  COMPARISON:  None.  FINDINGS: Four views of the left knee submitted. No acute fracture or subluxation. No radiopaque foreign body. No joint effusion. Minimal spurring of patella.  IMPRESSION: Negative.   Electronically Signed   By: Lahoma Crocker M.D.   On: 11/13/2014 10:37   I have personally reviewed and evaluated these images and lab results as part of my medical decision-making.   EKG Interpretation None       2:33 PM Patient seen and examined.  Vital signs reviewed and are as follows: BP 110/69 mmHg  Pulse 70  Temp(Src) 98.4 F (36.9 C) (Oral)  Resp 16  Ht 5\' 3"  (1.6 m)  Wt 228 lb (103.42 kg)  BMI 40.40 kg/m2  SpO2 100%  LMP 11/13/2014  Offered Toradol, patient refuses because it is a shot. Will give naproxen. Also Robaxin for home.  Patient counseled on proper use of muscle relaxant medication.  They were told not to drink alcohol, drive any vehicle, or do any dangerous  activities while taking this medication.  Patient verbalized understanding.    MDM   Final diagnoses:  Acute nonintractable headache, unspecified headache type  Bilateral thoracic back pain   Patient with continued headache and muscular tenderness after fall. X-rays yesterday were negative. No clinical signs of concussion or closed head injury and I do not feel that CT scan is indicated given lack of worsening symptoms or neurological deficits. Will attempt to control pain with muscle relaxer.    Carlisle Cater, PA-C 11/14/14 1450  Merrily Pew, MD 11/14/14 [phone]

## 2015-10-19 ENCOUNTER — Emergency Department (HOSPITAL_BASED_OUTPATIENT_CLINIC_OR_DEPARTMENT_OTHER)
Admission: EM | Admit: 2015-10-19 | Discharge: 2015-10-19 | Disposition: A | Discharge status: Home / Self Care | Payer: Self-pay | Attending: Emergency Medicine | Admitting: Emergency Medicine

## 2015-10-19 ENCOUNTER — Emergency Department (HOSPITAL_BASED_OUTPATIENT_CLINIC_OR_DEPARTMENT_OTHER): Payer: Self-pay

## 2015-10-19 ENCOUNTER — Encounter (HOSPITAL_BASED_OUTPATIENT_CLINIC_OR_DEPARTMENT_OTHER): Payer: Self-pay | Admitting: *Deleted

## 2015-10-19 DIAGNOSIS — J029 Acute pharyngitis, unspecified: Secondary | ICD-10-CM | POA: Insufficient documentation

## 2015-10-19 DIAGNOSIS — Z791 Long term (current) use of non-steroidal anti-inflammatories (NSAID): Secondary | ICD-10-CM | POA: Insufficient documentation

## 2015-10-19 DIAGNOSIS — M25562 Pain in left knee: Secondary | ICD-10-CM | POA: Insufficient documentation

## 2015-10-19 DIAGNOSIS — Z85030 Personal history of malignant carcinoid tumor of large intestine: Secondary | ICD-10-CM | POA: Insufficient documentation

## 2015-10-19 DIAGNOSIS — Z79899 Other long term (current) drug therapy: Secondary | ICD-10-CM | POA: Insufficient documentation

## 2015-10-19 LAB — RAPID STREP SCREEN (MED CTR MEBANE ONLY): Streptococcus, Group A Screen (Direct): NEGATIVE

## 2015-10-19 MED ORDER — NAPROXEN 500 MG PO TABS: 500.0000 mg | ORAL_TABLET | Freq: Two times a day (BID) | ORAL | 0 refills | Status: AC

## 2015-10-19 NOTE — ED Provider Notes (Signed)
[facility] DEPT [facility] Provider Note   CSN: [id number] Arrival date & time: 10/19/15  1021     History   Chief Complaint Chief Complaint  Patient presents with  . Knee Pain    HPI JAYDEN WIGGERS is a 50 y.o. female.  Patient presents with fullness sensation in her bilateral ears as well as sore throat for the past several days. States she had the same symptoms for 2 weeks that resolved 2 weeks ago but are now back. Has a full sensation in the ears but no hearing change. Sore throat hurts to swallow. No fever. No cough, shortness of breath, chest pain. No abdominal pain, nausea or vomiting. No sick contacts. Also complains of left knee pain for the past 2 days without injury. Worse with going upstairs and worse with knee flexion. No other medical problems. Colon cancer survivor. No sick contacts at home.   The history is provided by the patient and a relative.  Knee Pain      Past Medical History:  Diagnosis Date  . Allergy   . Cancer ([place])   . Colon cancer [facility])     Patient Active Problem List   Diagnosis Date Noted  . Anxiety and depression 01/05/2011  . Arm pain 01/05/2011  . 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 OBESITY 02/16/2010  . Allergic rhinitis, cause unspecified 02/16/2010  . PLANTAR FASCIITIS, BILATERAL 02/16/2010  . FATIGUE 02/16/2010  . SNORING 02/16/2010    Past Surgical History:  Procedure Laterality Date  . COLON SURGERY    . TUBAL LIGATION    . TUBAL LIGATION      OB History    No data available       Home Medications    Prior to Admission medications   Medication Sig Start Date End Date Taking? Authorizing Provider  benzonatate (TESSALON) 100 MG capsule Take 1 capsule (100 mg total) by mouth every 8 (eight) hours. 03/23/12   Julianne Rice, MD  benzonatate (TESSALON) 100 MG capsule Take 1 capsule (100 mg total) by mouth every 8 (eight) hours. 02/19/13   April Palumbo, MD  cholecalciferol (VITAMIN D) 1000 UNITS tablet Take 1,000 Units by mouth  daily.    Historical Provider, MD  citalopram (CELEXA) 20 MG tablet Take 1 tablet (20 mg total) by mouth daily. 01/05/11 01/05/12  Midge Minium, MD  cyclobenzaprine (FLEXERIL) 10 MG tablet Take 1 tablet (10 mg total) by mouth 3 (three) times daily as needed for muscle spasms. 01/03/13   Evelina Bucy, MD  cyclobenzaprine (FLEXERIL) 5 MG tablet Take 1 tablet (5 mg total) by mouth 3 (three) times daily as needed for muscle spasms. 11/23/11   Alfonzo Beers, MD  ibuprofen (ADVIL,MOTRIN) 600 MG tablet Take 1 tablet (600 mg total) by mouth every 6 (six) hours as needed for pain. 11/23/11   Alfonzo Beers, MD  ibuprofen (ADVIL,MOTRIN) 600 MG tablet Take 1 tablet (600 mg total) by mouth every 6 (six) hours as needed for pain. 03/23/12   Julianne Rice, MD  ibuprofen (ADVIL,MOTRIN) 600 MG tablet Take 1 tablet (600 mg total) by mouth every 6 (six) hours as needed for pain. 05/15/12   Blanchie Dessert, MD  ibuprofen (ADVIL,MOTRIN) 800 MG tablet Take 1 tablet (800 mg total) by mouth 3 (three) times daily. 01/03/13   Evelina Bucy, MD  ibuprofen (ADVIL,MOTRIN) 800 MG tablet Take 1 tablet (800 mg total) by mouth 3 (three) times daily. 02/19/13   April Palumbo, MD  methocarbamol (ROBAXIN) 500 MG tablet Take 1 tablet (  500 mg total) by mouth 4 (four) times daily. 11/14/14   Carlisle Cater, PA-C  nortriptyline (PAMELOR) 25 MG capsule Take 25 mg by mouth at bedtime.    Historical Provider, MD  oxyCODONE-acetaminophen (PERCOCET/ROXICET) 5-325 MG per tablet Take 1 tablet by mouth every 6 (six) hours as needed for pain. 05/15/12   Blanchie Dessert, MD  PHENTERMINE HCL PO Take by mouth.    Historical Provider, MD    Family History Family History  Problem Relation Age of Onset  . Asthma Mother   . Asthma Brother   . Emphysema Maternal Grandfather     Social History Social History  Substance Use Topics  . Smoking status: Never Smoker  . Smokeless tobacco: Not on file  . Alcohol use No     Allergies    Amoxicillin   Review of Systems Review of Systems  Constitutional: Negative for activity change, appetite change and fever.  HENT: Positive for congestion, ear pain, rhinorrhea, sinus pressure and sore throat. Negative for facial swelling, mouth sores, postnasal drip and trouble swallowing.   Eyes: Negative for visual disturbance.  Respiratory: Negative for cough, chest tightness and shortness of breath.   Cardiovascular: Negative for chest pain.  Gastrointestinal: Negative for diarrhea, nausea and vomiting.  Genitourinary: Negative for dysuria, hematuria, vaginal bleeding and vaginal discharge.  Musculoskeletal: Negative for arthralgias and myalgias.  Skin: Negative for rash.  Neurological: Negative for dizziness, weakness, light-headedness and headaches.   A complete 10 system review of systems was obtained and all systems are negative except as noted in the HPI and PMH.    Physical Exam Updated Vital Signs BP 133/95 (BP Location: Left Arm)   Pulse 73   Temp 98.4 F (36.9 C) (Oral)   Resp 18   Ht 5\' 3"  (1.6 m)   Wt 220 lb (99.8 kg)   LMP 09/23/2015   SpO2 98%   BMI 38.97 kg/m   Physical Exam  Constitutional: She is oriented to person, place, and time. She appears well-developed and well-nourished. No distress.  HENT:  Head: Normocephalic and atraumatic.  Mouth/Throat: Oropharynx is clear and moist. No oropharyngeal exudate.  TM appear normal bilaterally Mild OP erythema  Eyes: Conjunctivae and EOM are normal. Pupils are equal, round, and reactive to light.  Neck: Normal range of motion. Neck supple.  No meningismus.  Cardiovascular: Normal rate, regular rhythm, normal heart sounds and intact distal pulses.   No murmur heard. Pulmonary/Chest: Effort normal and breath sounds normal. No respiratory distress. She exhibits no tenderness.  Abdominal: Soft. There is no tenderness. There is no rebound and no guarding.  Musculoskeletal: Normal range of motion. She exhibits  tenderness. She exhibits no edema.  Tenderness to L lateral joint line Flexion and extension intact. No ligament laxity Intact distal pulses  Neurological: She is alert and oriented to person, place, and time. No cranial nerve deficit. She exhibits normal muscle tone. Coordination normal.   5/5 strength throughout. CN 2-12 intact.Equal grip strength.   Skin: Skin is warm.  Psychiatric: She has a normal mood and affect. Her behavior is normal.  Nursing note and vitals reviewed.    ED Treatments / Results  Labs (all labs ordered are listed, but only abnormal results are displayed) Labs Reviewed  RAPID STREP SCREEN (NOT AT [facility])    EKG  EKG Interpretation None       Radiology Dg Knee Complete 4 Views Left  Result Date: 10/19/2015 CLINICAL DATA:  Left knee pain starting yesterday EXAM: LEFT KNEE -  COMPLETE 4+ VIEW COMPARISON:  11/13/2014 FINDINGS: Four views of the left knee submitted. No acute fracture or subluxation. Minimal narrowing of medial joint compartment. Small joint effusion. Mild spurring of patella. IMPRESSION: No acute fracture or subluxation. Small joint effusion. Mild degenerative changes. Electronically Signed   By: Lahoma Crocker M.D.   On: 10/19/2015 13:59    Procedures Procedures (including critical care time)  Medications Ordered in ED Medications - No data to display   Initial Impression / Assessment and Plan / ED Course  I have reviewed the triage vital signs and the nursing notes.  Pertinent labs & imaging results that were available during my care of the patient were reviewed by me and considered in my medical decision making (see chart for details).  Clinical Course   2 days of atraumatic knee pain worse with flexion. Also with ear fullness and sore throat. No fever.  Well-appearing. Xray of L knee normal.  Knee sleeve given. Followup with sports med and PCP.  Supportive care for ear pain and throat irritation. Possibly allergic in nature or  viral. Return precautions discussed.  Final Clinical Impressions(s) / ED Diagnoses   Final diagnoses:  Left knee pain    New Prescriptions New Prescriptions   No medications on file     Ezequiel Essex, MD 10/19/15 1649

## 2015-10-19 NOTE — ED Notes (Signed)
Patient transported to X-ray 

## 2015-10-19 NOTE — ED Triage Notes (Signed)
Pt c/o left knee pain with flexion only since yesterday. Pt denies injury. Pt also c/o fullness feeling in ears with left greater than right.

## 2015-10-21 LAB — CULTURE, GROUP A STREP (THRC)

## 2015-10-22 ENCOUNTER — Telehealth (HOSPITAL_COMMUNITY): Payer: Self-pay

## 2015-10-22 NOTE — Telephone Encounter (Signed)
Post ED Visit - Positive Culture Follow-up: Successful Patient Follow-Up  Culture assessed and recommendations reviewed by: []  Elenor Quinones, Pharm.D. []  Heide Guile, Pharm.D., BCPS []  Parks Neptune, Pharm.D. []  Alycia Rossetti, Pharm.D., BCPS []  [place], Pharm.D., BCPS, AAHIVP []  Legrand Como, Pharm.D., BCPS, AAHIVP []  Cassie Nicole Kindred, Pharm.D. []  Stephens November, Pharm.D. Ezzie Dural, Pharm.D.  Positive Throat culture, Group A Strep Culture  []  Patient discharged without antimicrobial prescription and treatment is now indicated []  Organism is resistant to prescribed ED discharge antimicrobial []  Patient with positive blood cultures  Changes discussed with ED provider: Gay Filler PA New antibiotic prescription "Azithromycin 500 mg on day 1, 250 mg days 2-5 po" Called to [facility] [phone] and given to [place].  Contacted patient, date 10/22/2015, time 10:02  Pt informed   Dortha Kern 10/22/2015, 10:06 AM

## 2015-10-22 NOTE — Progress Notes (Signed)
ED Antimicrobial Stewardship Positive Culture Follow Up   Glenda Hudson is an 50 y.o. female who presented to [facility] on 10/19/2015 with a chief complaint of  Chief Complaint  Patient presents with  . Knee Pain    Recent Results (from the past 720 hour(s))  Rapid strep screen     Status: None   Collection Time: 10/19/15  1:25 PM  Result Value Ref Range Status   Streptococcus, Group A Screen (Direct) NEGATIVE NEGATIVE Final    Comment: (NOTE) A Rapid Antigen test may result negative if the antigen level in the sample is below the detection level of this test. The FDA has not cleared this test as a stand-alone test therefore the rapid antigen negative result has reflexed to a Group A Strep culture.   Culture, group A strep     Status: None   Collection Time: 10/19/15  1:25 PM  Result Value Ref Range Status   Specimen Description THROAT  Final   Special Requests NONE Reflexed from [id number]  Final   Culture   Final    FEW STREPTOCOCCUS,BETA HEMOLYTIC NOT GROUP A There is no known Penicillin Resistant Beta Streptococcus in the [place] For patients that are Penicillin-allergic, Erythromycin is 85-94% susceptible, and Clindamycin is 80% susceptible.  Contact Microbiology within 7 days if sensitivity testing is  required.   Performed at [facility]    Report Status 10/21/2015 FINAL  Final    [x]  Patient discharged originally without antimicrobial agent and treatment is now indicated  New antibiotic prescription: Azithromycin PO 500mg  on day 1, then 250mg  on days 2-5 (Z-pak)  ED Provider: Gay Filler, PA-C  Wyonia Hough), PharmD  PGY1 Pharmacy Resident Pager: [phone] 10/22/2015 8:49 AM

## 2016-03-02 DIAGNOSIS — K921 Melena: Secondary | ICD-10-CM | POA: Diagnosis not present

## 2016-03-02 DIAGNOSIS — Z85038 Personal history of other malignant neoplasm of large intestine: Secondary | ICD-10-CM | POA: Diagnosis not present

## 2016-03-03 DIAGNOSIS — N6320 Unspecified lump in the left breast, unspecified quadrant: Secondary | ICD-10-CM | POA: Diagnosis not present

## 2016-03-03 DIAGNOSIS — N6489 Other specified disorders of breast: Secondary | ICD-10-CM | POA: Diagnosis not present

## 2016-03-03 DIAGNOSIS — R928 Other abnormal and inconclusive findings on diagnostic imaging of breast: Secondary | ICD-10-CM | POA: Diagnosis not present

## 2016-03-08 DIAGNOSIS — K5140 Inflammatory polyps of colon without complications: Secondary | ICD-10-CM | POA: Diagnosis not present

## 2016-03-08 DIAGNOSIS — K641 Second degree hemorrhoids: Secondary | ICD-10-CM | POA: Diagnosis not present

## 2016-03-08 DIAGNOSIS — Z859 Personal history of malignant neoplasm, unspecified: Secondary | ICD-10-CM | POA: Diagnosis not present

## 2016-03-08 DIAGNOSIS — D120 Benign neoplasm of cecum: Secondary | ICD-10-CM | POA: Diagnosis not present

## 2016-03-08 DIAGNOSIS — D126 Benign neoplasm of colon, unspecified: Secondary | ICD-10-CM | POA: Diagnosis not present

## 2016-03-08 DIAGNOSIS — K921 Melena: Secondary | ICD-10-CM | POA: Diagnosis not present

## 2016-03-08 DIAGNOSIS — K9189 Other postprocedural complications and disorders of digestive system: Secondary | ICD-10-CM | POA: Diagnosis not present

## 2016-03-08 DIAGNOSIS — K5730 Diverticulosis of large intestine without perforation or abscess without bleeding: Secondary | ICD-10-CM | POA: Diagnosis not present

## 2016-03-08 DIAGNOSIS — K648 Other hemorrhoids: Secondary | ICD-10-CM | POA: Diagnosis not present

## 2016-03-08 DIAGNOSIS — Z85038 Personal history of other malignant neoplasm of large intestine: Secondary | ICD-10-CM | POA: Diagnosis not present

## 2016-03-08 DIAGNOSIS — K31819 Angiodysplasia of stomach and duodenum without bleeding: Secondary | ICD-10-CM | POA: Diagnosis not present

## 2016-03-08 DIAGNOSIS — K5740 Diverticulitis of both small and large intestine with perforation and abscess without bleeding: Secondary | ICD-10-CM | POA: Diagnosis not present

## 2016-03-12 DIAGNOSIS — F411 Generalized anxiety disorder: Secondary | ICD-10-CM | POA: Diagnosis not present

## 2016-04-02 DIAGNOSIS — F411 Generalized anxiety disorder: Secondary | ICD-10-CM | POA: Diagnosis not present

## 2016-04-13 DIAGNOSIS — Z85038 Personal history of other malignant neoplasm of large intestine: Secondary | ICD-10-CM | POA: Diagnosis not present

## 2016-04-13 DIAGNOSIS — E559 Vitamin D deficiency, unspecified: Secondary | ICD-10-CM | POA: Diagnosis not present

## 2016-04-13 DIAGNOSIS — R0789 Other chest pain: Secondary | ICD-10-CM | POA: Diagnosis not present

## 2016-04-13 DIAGNOSIS — K219 Gastro-esophageal reflux disease without esophagitis: Secondary | ICD-10-CM | POA: Diagnosis not present

## 2016-04-16 DIAGNOSIS — F411 Generalized anxiety disorder: Secondary | ICD-10-CM | POA: Diagnosis not present

## 2016-06-10 DIAGNOSIS — K219 Gastro-esophageal reflux disease without esophagitis: Secondary | ICD-10-CM | POA: Diagnosis not present

## 2016-06-10 DIAGNOSIS — E559 Vitamin D deficiency, unspecified: Secondary | ICD-10-CM | POA: Diagnosis not present

## 2016-06-10 DIAGNOSIS — Z85038 Personal history of other malignant neoplasm of large intestine: Secondary | ICD-10-CM | POA: Diagnosis not present

## 2016-09-06 DIAGNOSIS — F411 Generalized anxiety disorder: Secondary | ICD-10-CM | POA: Diagnosis not present

## 2016-09-07 DIAGNOSIS — Z131 Encounter for screening for diabetes mellitus: Secondary | ICD-10-CM | POA: Diagnosis not present

## 2016-09-07 DIAGNOSIS — H539 Unspecified visual disturbance: Secondary | ICD-10-CM | POA: Diagnosis not present

## 2016-09-07 DIAGNOSIS — Z0000 Encounter for general adult medical examination without abnormal findings: Secondary | ICD-10-CM | POA: Diagnosis not present

## 2016-09-07 DIAGNOSIS — Z85038 Personal history of other malignant neoplasm of large intestine: Secondary | ICD-10-CM | POA: Diagnosis not present

## 2016-09-07 DIAGNOSIS — K219 Gastro-esophageal reflux disease without esophagitis: Secondary | ICD-10-CM | POA: Diagnosis not present

## 2016-09-07 DIAGNOSIS — E559 Vitamin D deficiency, unspecified: Secondary | ICD-10-CM | POA: Diagnosis not present

## 2016-09-07 DIAGNOSIS — Z0110 Encounter for examination of ears and hearing without abnormal findings: Secondary | ICD-10-CM | POA: Diagnosis not present

## 2016-09-07 DIAGNOSIS — M549 Dorsalgia, unspecified: Secondary | ICD-10-CM | POA: Diagnosis not present

## 2016-09-09 DIAGNOSIS — F411 Generalized anxiety disorder: Secondary | ICD-10-CM | POA: Diagnosis not present

## 2016-09-30 DIAGNOSIS — F411 Generalized anxiety disorder: Secondary | ICD-10-CM | POA: Diagnosis not present

## 2016-10-04 DIAGNOSIS — F411 Generalized anxiety disorder: Secondary | ICD-10-CM | POA: Diagnosis not present

## 2016-10-05 DIAGNOSIS — M2550 Pain in unspecified joint: Secondary | ICD-10-CM | POA: Diagnosis not present

## 2016-10-05 DIAGNOSIS — E559 Vitamin D deficiency, unspecified: Secondary | ICD-10-CM | POA: Diagnosis not present

## 2016-10-05 DIAGNOSIS — Z85038 Personal history of other malignant neoplasm of large intestine: Secondary | ICD-10-CM | POA: Diagnosis not present

## 2016-10-05 DIAGNOSIS — K219 Gastro-esophageal reflux disease without esophagitis: Secondary | ICD-10-CM | POA: Diagnosis not present

## 2016-10-12 DIAGNOSIS — K219 Gastro-esophageal reflux disease without esophagitis: Secondary | ICD-10-CM | POA: Diagnosis not present

## 2016-10-12 DIAGNOSIS — E559 Vitamin D deficiency, unspecified: Secondary | ICD-10-CM | POA: Diagnosis not present

## 2016-10-12 DIAGNOSIS — M2550 Pain in unspecified joint: Secondary | ICD-10-CM | POA: Diagnosis not present

## 2016-11-15 DIAGNOSIS — F411 Generalized anxiety disorder: Secondary | ICD-10-CM | POA: Diagnosis not present

## 2016-11-16 DIAGNOSIS — F900 Attention-deficit hyperactivity disorder, predominantly inattentive type: Secondary | ICD-10-CM | POA: Diagnosis not present

## 2016-11-16 DIAGNOSIS — M2550 Pain in unspecified joint: Secondary | ICD-10-CM | POA: Diagnosis not present

## 2016-11-16 DIAGNOSIS — E559 Vitamin D deficiency, unspecified: Secondary | ICD-10-CM | POA: Diagnosis not present

## 2016-11-30 DIAGNOSIS — F411 Generalized anxiety disorder: Secondary | ICD-10-CM | POA: Diagnosis not present

## 2016-12-07 DIAGNOSIS — F411 Generalized anxiety disorder: Secondary | ICD-10-CM | POA: Diagnosis not present

## 2016-12-10 DIAGNOSIS — F411 Generalized anxiety disorder: Secondary | ICD-10-CM | POA: Diagnosis not present

## 2016-12-13 DIAGNOSIS — F411 Generalized anxiety disorder: Secondary | ICD-10-CM | POA: Diagnosis not present

## 2016-12-14 DIAGNOSIS — F900 Attention-deficit hyperactivity disorder, predominantly inattentive type: Secondary | ICD-10-CM | POA: Diagnosis not present

## 2016-12-14 DIAGNOSIS — M2550 Pain in unspecified joint: Secondary | ICD-10-CM | POA: Diagnosis not present

## 2016-12-14 DIAGNOSIS — E559 Vitamin D deficiency, unspecified: Secondary | ICD-10-CM | POA: Diagnosis not present

## 2016-12-17 DIAGNOSIS — F411 Generalized anxiety disorder: Secondary | ICD-10-CM | POA: Diagnosis not present

## 2016-12-21 DIAGNOSIS — F411 Generalized anxiety disorder: Secondary | ICD-10-CM | POA: Diagnosis not present

## 2016-12-24 DIAGNOSIS — F411 Generalized anxiety disorder: Secondary | ICD-10-CM | POA: Diagnosis not present

## 2017-01-04 DIAGNOSIS — F411 Generalized anxiety disorder: Secondary | ICD-10-CM | POA: Diagnosis not present

## 2017-01-07 DIAGNOSIS — M25562 Pain in left knee: Secondary | ICD-10-CM | POA: Diagnosis not present

## 2017-01-07 DIAGNOSIS — M170 Bilateral primary osteoarthritis of knee: Secondary | ICD-10-CM | POA: Diagnosis not present

## 2017-01-07 DIAGNOSIS — S161XXA Strain of muscle, fascia and tendon at neck level, initial encounter: Secondary | ICD-10-CM | POA: Diagnosis not present

## 2017-01-07 DIAGNOSIS — R10813 Right lower quadrant abdominal tenderness: Secondary | ICD-10-CM | POA: Diagnosis not present

## 2017-01-07 DIAGNOSIS — M542 Cervicalgia: Secondary | ICD-10-CM | POA: Diagnosis not present

## 2017-01-07 DIAGNOSIS — Z87891 Personal history of nicotine dependence: Secondary | ICD-10-CM | POA: Diagnosis not present

## 2017-01-07 DIAGNOSIS — T148XXA Other injury of unspecified body region, initial encounter: Secondary | ICD-10-CM | POA: Diagnosis not present

## 2017-01-07 DIAGNOSIS — M47812 Spondylosis without myelopathy or radiculopathy, cervical region: Secondary | ICD-10-CM | POA: Diagnosis not present

## 2017-01-11 DIAGNOSIS — F411 Generalized anxiety disorder: Secondary | ICD-10-CM | POA: Diagnosis not present

## 2017-01-11 DIAGNOSIS — F900 Attention-deficit hyperactivity disorder, predominantly inattentive type: Secondary | ICD-10-CM | POA: Diagnosis not present

## 2017-01-11 DIAGNOSIS — R52 Pain, unspecified: Secondary | ICD-10-CM | POA: Diagnosis not present

## 2017-01-11 DIAGNOSIS — M2550 Pain in unspecified joint: Secondary | ICD-10-CM | POA: Diagnosis not present

## 2017-01-11 DIAGNOSIS — E559 Vitamin D deficiency, unspecified: Secondary | ICD-10-CM | POA: Diagnosis not present

## 2017-01-11 DIAGNOSIS — Z5181 Encounter for therapeutic drug level monitoring: Secondary | ICD-10-CM | POA: Diagnosis not present

## 2017-01-11 DIAGNOSIS — F4323 Adjustment disorder with mixed anxiety and depressed mood: Secondary | ICD-10-CM | POA: Diagnosis not present

## 2017-01-14 DIAGNOSIS — F411 Generalized anxiety disorder: Secondary | ICD-10-CM | POA: Diagnosis not present

## 2017-01-18 DIAGNOSIS — F4323 Adjustment disorder with mixed anxiety and depressed mood: Secondary | ICD-10-CM | POA: Diagnosis not present

## 2017-01-18 DIAGNOSIS — F411 Generalized anxiety disorder: Secondary | ICD-10-CM | POA: Diagnosis not present

## 2017-01-26 DIAGNOSIS — F411 Generalized anxiety disorder: Secondary | ICD-10-CM | POA: Diagnosis not present

## 2017-01-31 DIAGNOSIS — F411 Generalized anxiety disorder: Secondary | ICD-10-CM | POA: Diagnosis not present

## 2017-02-03 DIAGNOSIS — F4323 Adjustment disorder with mixed anxiety and depressed mood: Secondary | ICD-10-CM | POA: Diagnosis not present

## 2017-02-08 DIAGNOSIS — F4323 Adjustment disorder with mixed anxiety and depressed mood: Secondary | ICD-10-CM | POA: Diagnosis not present

## 2017-02-28 DIAGNOSIS — F411 Generalized anxiety disorder: Secondary | ICD-10-CM | POA: Diagnosis not present

## 2017-03-10 DIAGNOSIS — F900 Attention-deficit hyperactivity disorder, predominantly inattentive type: Secondary | ICD-10-CM | POA: Diagnosis not present

## 2017-03-10 DIAGNOSIS — E559 Vitamin D deficiency, unspecified: Secondary | ICD-10-CM | POA: Diagnosis not present

## 2017-03-10 DIAGNOSIS — M2550 Pain in unspecified joint: Secondary | ICD-10-CM | POA: Diagnosis not present

## 2017-03-11 DIAGNOSIS — F411 Generalized anxiety disorder: Secondary | ICD-10-CM | POA: Diagnosis not present

## 2017-03-18 DIAGNOSIS — F411 Generalized anxiety disorder: Secondary | ICD-10-CM | POA: Diagnosis not present

## 2017-03-22 DIAGNOSIS — F411 Generalized anxiety disorder: Secondary | ICD-10-CM | POA: Diagnosis not present

## 2017-03-23 ENCOUNTER — Encounter (INDEPENDENT_AMBULATORY_CARE_PROVIDER_SITE_OTHER): Payer: Self-pay | Admitting: Orthopedic Surgery

## 2017-03-23 ENCOUNTER — Ambulatory Visit (INDEPENDENT_AMBULATORY_CARE_PROVIDER_SITE_OTHER): Payer: BLUE CROSS/BLUE SHIELD | Admitting: Orthopedic Surgery

## 2017-03-23 DIAGNOSIS — M5416 Radiculopathy, lumbar region: Secondary | ICD-10-CM | POA: Diagnosis not present

## 2017-03-23 MED ORDER — DIAZEPAM 10 MG PO TABS: ORAL_TABLET | ORAL | 0 refills | Status: DC

## 2017-03-26 ENCOUNTER — Encounter (INDEPENDENT_AMBULATORY_CARE_PROVIDER_SITE_OTHER): Payer: Self-pay | Admitting: Orthopedic Surgery
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
## 2017-03-29 DIAGNOSIS — F900 Attention-deficit hyperactivity disorder, predominantly inattentive type: Secondary | ICD-10-CM | POA: Diagnosis not present

## 2017-03-29 DIAGNOSIS — Z5181 Encounter for therapeutic drug level monitoring: Secondary | ICD-10-CM | POA: Diagnosis not present

## 2017-03-29 DIAGNOSIS — M2550 Pain in unspecified joint: Secondary | ICD-10-CM | POA: Diagnosis not present

## 2017-03-29 DIAGNOSIS — E559 Vitamin D deficiency, unspecified: Secondary | ICD-10-CM | POA: Diagnosis not present

## 2017-04-13 ENCOUNTER — Telehealth (INDEPENDENT_AMBULATORY_CARE_PROVIDER_SITE_OTHER): Payer: Self-pay | Admitting: *Deleted

## 2017-04-13 MED ORDER — DIAZEPAM 5 MG PO TABS: ORAL_TABLET | ORAL | 0 refills | Status: DC

## 2017-04-13 NOTE — Telephone Encounter (Signed)
Ok for valium? 

## 2017-04-13 NOTE — Addendum Note (Signed)
Addended byLaurann Montana on: 04/13/2017 02:33 PM   Modules accepted: Orders

## 2017-04-13 NOTE — Telephone Encounter (Signed)
Pt is having MRI done on 21 FEB and states is claustrophobic so would like something to help relax and take the edge off.   Pharmacy [facility].

## 2017-04-13 NOTE — Telephone Encounter (Signed)
rx called. Advised patient done.

## 2017-04-13 NOTE — Telephone Encounter (Signed)
y

## 2017-04-15 DIAGNOSIS — F411 Generalized anxiety disorder: Secondary | ICD-10-CM | POA: Diagnosis not present

## 2017-04-18 DIAGNOSIS — F411 Generalized anxiety disorder: Secondary | ICD-10-CM | POA: Diagnosis not present

## 2017-04-21 ENCOUNTER — Other Ambulatory Visit: Payer: Self-pay

## 2017-04-25 ENCOUNTER — Ambulatory Visit (INDEPENDENT_AMBULATORY_CARE_PROVIDER_SITE_OTHER): Payer: BLUE CROSS/BLUE SHIELD | Admitting: Orthopedic Surgery

## 2017-05-01 ENCOUNTER — Ambulatory Visit
Admission: RE | Admit: 2017-05-01 | Discharge: 2017-05-01 | Disposition: A | Discharge status: Home / Self Care | Payer: BLUE CROSS/BLUE SHIELD | Source: Ambulatory Visit | Attending: Orthopedic Surgery | Admitting: Orthopedic Surgery

## 2017-05-01 DIAGNOSIS — M48061 Spinal stenosis, lumbar region without neurogenic claudication: Secondary | ICD-10-CM | POA: Diagnosis not present

## 2017-05-01 DIAGNOSIS — M5416 Radiculopathy, lumbar region: Secondary | ICD-10-CM

## 2017-05-01 DIAGNOSIS — M25551 Pain in right hip: Secondary | ICD-10-CM | POA: Diagnosis not present

## 2017-05-09 DIAGNOSIS — Z85038 Personal history of other malignant neoplasm of large intestine: Secondary | ICD-10-CM | POA: Diagnosis not present

## 2017-05-09 DIAGNOSIS — K219 Gastro-esophageal reflux disease without esophagitis: Secondary | ICD-10-CM | POA: Diagnosis not present

## 2017-05-09 DIAGNOSIS — R109 Unspecified abdominal pain: Secondary | ICD-10-CM | POA: Diagnosis not present

## 2017-05-09 DIAGNOSIS — E559 Vitamin D deficiency, unspecified: Secondary | ICD-10-CM | POA: Diagnosis not present

## 2017-05-10 ENCOUNTER — Other Ambulatory Visit: Payer: Self-pay

## 2017-05-10 ENCOUNTER — Emergency Department (HOSPITAL_BASED_OUTPATIENT_CLINIC_OR_DEPARTMENT_OTHER): Payer: BLUE CROSS/BLUE SHIELD

## 2017-05-10 ENCOUNTER — Encounter (HOSPITAL_BASED_OUTPATIENT_CLINIC_OR_DEPARTMENT_OTHER): Payer: Self-pay | Admitting: Emergency Medicine

## 2017-05-10 ENCOUNTER — Emergency Department (HOSPITAL_BASED_OUTPATIENT_CLINIC_OR_DEPARTMENT_OTHER)
Admission: EM | Admit: 2017-05-10 | Discharge: 2017-05-11 | Disposition: A | Discharge status: Home / Self Care | Payer: BLUE CROSS/BLUE SHIELD | Attending: Emergency Medicine | Admitting: Emergency Medicine

## 2017-05-10 DIAGNOSIS — A5909 Other urogenital trichomoniasis: Secondary | ICD-10-CM | POA: Diagnosis not present

## 2017-05-10 DIAGNOSIS — N200 Calculus of kidney: Secondary | ICD-10-CM | POA: Diagnosis not present

## 2017-05-10 DIAGNOSIS — Z79899 Other long term (current) drug therapy: Secondary | ICD-10-CM | POA: Diagnosis not present

## 2017-05-10 DIAGNOSIS — N3001 Acute cystitis with hematuria: Secondary | ICD-10-CM | POA: Diagnosis not present

## 2017-05-10 DIAGNOSIS — K5900 Constipation, unspecified: Secondary | ICD-10-CM | POA: Insufficient documentation

## 2017-05-10 DIAGNOSIS — R1032 Left lower quadrant pain: Secondary | ICD-10-CM | POA: Diagnosis not present

## 2017-05-10 DIAGNOSIS — A599 Trichomoniasis, unspecified: Secondary | ICD-10-CM

## 2017-05-10 DIAGNOSIS — N132 Hydronephrosis with renal and ureteral calculous obstruction: Secondary | ICD-10-CM | POA: Diagnosis not present

## 2017-05-10 LAB — COMPREHENSIVE METABOLIC PANEL
ALBUMIN: 3.9 g/dL (ref 3.5–5.0)
ALT: 16 U/L (ref 14–54)
ANION GAP: 8 (ref 5–15)
AST: 18 U/L (ref 15–41)
Alkaline Phosphatase: 71 U/L (ref 38–126)
BILIRUBIN TOTAL: 0.6 mg/dL (ref 0.3–1.2)
BUN: 12 mg/dL (ref 6–20)
CALCIUM: 9 mg/dL (ref 8.9–10.3)
CO2: 27 mmol/L (ref 22–32)
Chloride: 99 mmol/L — ABNORMAL LOW (ref 101–111)
Creatinine, Ser: 1.04 mg/dL — ABNORMAL HIGH (ref 0.44–1.00)
GFR calc Af Amer: >60 mL/min (ref >60)
GFR calc non Af Amer: >60 mL/min (ref >60)
GLUCOSE: 101 mg/dL — AB (ref 65–99)
Potassium: 3.9 mmol/L (ref 3.5–5.1)
SODIUM: 134 mmol/L — AB (ref 135–145)
TOTAL PROTEIN: 7.8 g/dL (ref 6.5–8.1)

## 2017-05-10 LAB — URINALYSIS, ROUTINE W REFLEX MICROSCOPIC
Bilirubin Urine: NEGATIVE
GLUCOSE, UA: NEGATIVE mg/dL
Ketones, ur: NEGATIVE mg/dL
Nitrite: NEGATIVE
Protein, ur: NEGATIVE mg/dL
SPECIFIC GRAVITY, URINE: 1.01 (ref 1.005–1.030)
pH: 7 (ref 5.0–8.0)

## 2017-05-10 LAB — URINALYSIS, MICROSCOPIC (REFLEX)

## 2017-05-10 LAB — CBC WITH DIFFERENTIAL/PLATELET
BASOS ABS: 0 10*3/uL (ref 0.0–0.1)
BASOS PCT: 0 %
Eosinophils Absolute: 0.1 10*3/uL (ref 0.0–0.7)
Eosinophils Relative: 0 %
HEMATOCRIT: 40.3 % (ref 36.0–46.0)
HEMOGLOBIN: 14 g/dL (ref 12.0–15.0)
LYMPHS PCT: 10 %
Lymphs Abs: 1.3 10*3/uL (ref 0.7–4.0)
MCH: 32.8 pg (ref 26.0–34.0)
MCHC: 34.7 g/dL (ref 30.0–36.0)
MCV: 94.4 fL (ref 78.0–100.0)
MONO ABS: 0.7 10*3/uL (ref 0.1–1.0)
Monocytes Relative: 5 %
NEUTROS ABS: 11.6 10*3/uL — AB (ref 1.7–7.7)
NEUTROS PCT: 85 %
Platelets: 242 10*3/uL (ref 150–400)
RBC: 4.27 MIL/uL (ref 3.87–5.11)
RDW: 12.9 % (ref 11.5–15.5)
WBC: 13.6 10*3/uL — ABNORMAL HIGH (ref 4.0–10.5)

## 2017-05-10 MED ORDER — METRONIDAZOLE 500 MG PO TABS: 500.0000 mg | ORAL_TABLET | Freq: Two times a day (BID) | ORAL | 0 refills | Status: AC

## 2017-05-10 MED ORDER — ONDANSETRON 4 MG PO TBDP: ORAL_TABLET | ORAL | 0 refills | Status: AC

## 2017-05-10 MED ORDER — HYDROCODONE-ACETAMINOPHEN 5-325 MG PO TABS: 1.0000 | ORAL_TABLET | ORAL | 0 refills | Status: AC | PRN

## 2017-05-10 MED ORDER — SULFAMETHOXAZOLE-TRIMETHOPRIM 800-160 MG PO TABS: 1.0000 | ORAL_TABLET | Freq: Two times a day (BID) | ORAL | 0 refills | Status: AC

## 2017-05-10 MED ORDER — SODIUM CHLORIDE 0.9 % IV SOLN
1.0000 g | Freq: Once | INTRAVENOUS | Status: AC
  Administered 2017-05-10: 1 g via INTRAVENOUS
  Filled 2017-05-10: qty 10

## 2017-05-10 MED ORDER — FENTANYL CITRATE (PF) 100 MCG/2ML IJ SOLN
50.0000 ug | Freq: Once | INTRAMUSCULAR | Status: AC
  Administered 2017-05-10: 50 ug via INTRAVENOUS
  Filled 2017-05-10: qty 2

## 2017-05-10 MED ORDER — TAMSULOSIN HCL 0.4 MG PO CAPS: 0.4000 mg | ORAL_CAPSULE | Freq: Every day | ORAL | 0 refills | Status: AC

## 2017-05-10 MED ORDER — KETOROLAC TROMETHAMINE 30 MG/ML IJ SOLN
30.0000 mg | Freq: Once | INTRAMUSCULAR | Status: AC
  Administered 2017-05-10: 30 mg via INTRAVENOUS
  Filled 2017-05-10: qty 1

## 2017-05-10 NOTE — ED Provider Notes (Signed)
[facility] Provider Note   CSN: [id number] Arrival date & time: 05/10/17  1725     History   Chief Complaint Chief Complaint  Patient presents with  . Flank Pain  . Constipation    HPI [facility] is a 53 y.o. female.  Patient is a 52 year old female with a prior history of colon cancer who presents with left-sided abdominal pain.  She states she has had a 3-day history of worsening pain to her left abdomen radiating to her left flank.  She feels like it may be constipation as she has not had a bowel movement in about 4 days.  She has had some associated nausea and vomiting today.  No fevers.  She has had some chills however.  She denies any urinary symptoms.  No blood in her stool.  She denies any past history of constipation other than one time when she was taking some pain medication after surgery.  She is tried Dulcolax without improvement in symptoms.      Past Medical History:  Diagnosis Date  . Allergy   . Cancer ([place])   . Colon cancer [facility])     Patient Active Problem List   Diagnosis Date Noted  . Anxiety and depression 01/05/2011  . Arm pain 01/05/2011  . 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 OBESITY 02/16/2010  . Allergic rhinitis, cause unspecified 02/16/2010  . PLANTAR FASCIITIS, BILATERAL 02/16/2010  . FATIGUE 02/16/2010  . SNORING 02/16/2010    Past Surgical History:  Procedure Laterality Date  . COLON SURGERY    . TUBAL LIGATION    . TUBAL LIGATION      OB History    No data available       Home Medications    Prior to Admission medications   Medication Sig Start Date End Date Taking? Authorizing Provider  amphetamine-dextroamphetamine (ADDERALL) 15 MG tablet Take 1 tablet by mouth 2 (two) times daily. 12/31/16  Yes [provider]  PHENTERMINE HCL PO Take by mouth.   Yes [provider]  cholecalciferol (VITAMIN D) 1000 UNITS tablet Take 1,000 Units by mouth daily.    [provider]    citalopram (CELEXA) 20 MG tablet Take 1 tablet (20 mg total) by mouth daily. 01/05/11 01/05/12  Midge Minium, MD  cyclobenzaprine (FLEXERIL) 10 MG tablet Take 1 tablet (10 mg total) by mouth 3 (three) times daily as needed for muscle spasms. 01/03/13   Evelina Bucy, MD  diazepam (VALIUM) 10 MG tablet 1 po prior to MRI scan. Repeat if needed 03/23/17   Meredith Pel, MD  diazepam (VALIUM) 5 MG tablet 1 po 1 hr prior to MRI procedure. Repeat if needed 04/13/17   Meredith Pel, MD  HYDROcodone-acetaminophen (NORCO/VICODIN) 5-325 MG tablet Take 1-2 tablets by mouth every 4 (four) hours as needed. 05/10/17   Malvin Johns, MD  hydrOXYzine (VISTARIL) 25 MG capsule Take 25 mg by mouth at bedtime. 03/10/17   [provider]  ibuprofen (ADVIL,MOTRIN) 600 MG tablet Take 1 tablet (600 mg total) by mouth every 6 (six) hours as needed for pain. 11/23/11   Mabe, Forbes Cellar, MD  meloxicam (MOBIC) 15 MG tablet Take 15 mg by mouth daily. 02/25/17   [provider]  methocarbamol (ROBAXIN) 500 MG tablet Take 1 tablet (500 mg total) by mouth 4 (four) times daily. 11/14/14   Carlisle Cater, PA-C  metroNIDAZOLE (FLAGYL) 500 MG tablet Take 1 tablet (500 mg total) by mouth 2 (two) times daily.  One po bid x 7 days 05/10/17   Malvin Johns, MD  naproxen (NAPROSYN) 500 MG tablet Take 1 tablet (500 mg total) by mouth 2 (two) times daily. 10/19/15   Rancour, Annie Main, MD  nortriptyline (PAMELOR) 25 MG capsule Take 25 mg by mouth at bedtime.    [provider]  ondansetron (ZOFRAN ODT) 4 MG disintegrating tablet 4mg  ODT q4 hours prn nausea/vomit 05/10/17   Malvin Johns, MD  oxyCODONE-acetaminophen (PERCOCET/ROXICET) 5-325 MG per tablet Take 1 tablet by mouth every 6 (six) hours as needed for pain. 05/15/12   Blanchie Dessert, MD  sulfamethoxazole-trimethoprim (BACTRIM DS,SEPTRA DS) 800-160 MG tablet Take 1 tablet by mouth 2 (two) times daily for 7 days. 05/10/17 05/17/17  Malvin Johns, MD   tamsulosin (FLOMAX) 0.4 MG CAPS capsule Take 1 capsule (0.4 mg total) by mouth daily. 05/10/17   Malvin Johns, MD    Family History Family History  Problem Relation Age of Onset  . Asthma Mother   . Asthma Brother   . Emphysema Maternal Grandfather     Social History Social History   Tobacco Use  . Smoking status: Never Smoker  . Smokeless tobacco: Never Used  Substance Use Topics  . Alcohol use: No  . Drug use: No     Allergies   Amoxicillin   Review of Systems Review of Systems  Constitutional: Negative for chills, diaphoresis, fatigue and fever.  HENT: Negative for congestion, rhinorrhea and sneezing.   Eyes: Negative.   Respiratory: Negative for cough, chest tightness and shortness of breath.   Cardiovascular: Negative for chest pain and leg swelling.  Gastrointestinal: Positive for abdominal pain, constipation, nausea and vomiting. Negative for blood in stool and diarrhea.  Genitourinary: Negative for difficulty urinating, flank pain, frequency and hematuria.  Musculoskeletal: Negative for arthralgias and back pain.  Skin: Negative for rash.  Neurological: Negative for dizziness, speech difficulty, weakness, numbness and headaches.     Physical Exam Updated Vital Signs BP (!) 155/100   Pulse 72   Temp 98.8 F (37.1 C) (Oral)   Resp 17   Ht 5\' 3"  (1.6 m)   Wt 92.1 kg (203 lb)   SpO2 98%   BMI 35.96 kg/m   Physical Exam  Constitutional: She is oriented to person, place, and time. She appears well-developed and well-nourished.  HENT:  Head: Normocephalic and atraumatic.  Eyes: Pupils are equal, round, and reactive to light.  Neck: Normal range of motion. Neck supple.  Cardiovascular: Normal rate, regular rhythm and normal heart sounds.  Pulmonary/Chest: Effort normal and breath sounds normal. No respiratory distress. She has no wheezes. She has no rales. She exhibits no tenderness.  Abdominal: Soft. Bowel sounds are normal. There is tenderness  (Positive tenderness to the left lower abdomen and left flank). There is no rebound and no guarding.  Genitourinary:  Genitourinary Comments: No impaction on rectal exam, no gross blood  Musculoskeletal: Normal range of motion. She exhibits no edema.  Lymphadenopathy:    She has no cervical adenopathy.  Neurological: She is alert and oriented to person, place, and time.  Skin: Skin is warm and dry. No rash noted.  Psychiatric: She has a normal mood and affect.     ED Treatments / Results  Labs (all labs ordered are listed, but only abnormal results are displayed) Labs Reviewed  URINALYSIS, ROUTINE W REFLEX MICROSCOPIC - Abnormal; Notable for the following components:      Result Value   APPearance CLOUDY (*)    Hgb urine  dipstick SMALL (*)    Leukocytes, UA MODERATE (*)    All other components within normal limits  URINALYSIS, MICROSCOPIC (REFLEX) - Abnormal; Notable for the following components:   Bacteria, UA MANY (*)    Squamous Epithelial / LPF TOO NUMEROUS TO COUNT (*)    All other components within normal limits  COMPREHENSIVE METABOLIC PANEL - Abnormal; Notable for the following components:   Sodium 134 (*)    Chloride 99 (*)    Glucose, Bld 101 (*)    Creatinine, Ser 1.04 (*)    All other components within normal limits  CBC WITH DIFFERENTIAL/PLATELET - Abnormal; Notable for the following components:   WBC 13.6 (*)    Neutro Abs 11.6 (*)    All other components within normal limits  URINE CULTURE    EKG  EKG Interpretation None       Radiology Ct Renal Stone Study  Result Date: 05/10/2017 CLINICAL DATA:  Left flank and abdominal pain with constipation x2 days. Some hematuria. History of colon cancer in 2014. EXAM: CT ABDOMEN AND PELVIS WITHOUT CONTRAST TECHNIQUE: Multidetector CT imaging of the abdomen and pelvis was performed following the standard protocol without IV contrast. COMPARISON:  Lumbar spine radiographs which includes pelvis from 05/15/2012  FINDINGS: Lower chest: Top-normal size heart without pericardial effusion. No active pulmonary disease. Hepatobiliary: No space-occupying mass of the unenhanced liver. No biliary dilatation. Nondistended gallbladder without calculus. Pancreas: Normal Spleen: Normal Adrenals/Urinary Tract: Normal bilateral adrenal glands. Punctate nonobstructing left lower pole renal calculus measuring 2 mm. Mild left-sided hydroureteronephrosis attributable to what appears to be a punctate 1-2 mm calculus just proximal to left UVJ. Adjacent more medially situated pair of phleboliths correspond with calcifications seen on the lumbar spine radiographs. Similar phlebolith in the right hemipelvis corresponding with the calcifications seen on prior lumbar spine radiograph. Physiologic distention of the urinary bladder without focal mural thickening or calculus. Stomach/Bowel: Staple line at the rectosigmoid junction. Decompressed stomach. Normal small bowel rotation without inflammation. Moderate stool burden within the colon. The appendix is not visualized but no findings of acute appendicitis. Scattered left-sided colonic diverticulosis without acute diverticulitis. Vascular/Lymphatic: No significant vascular findings are present. No enlarged abdominal or pelvic lymph nodes. Reproductive: Uterus and bilateral adnexa are unremarkable. Other: No abdominal wall hernia or abnormality. No abdominopelvic ascites. Musculoskeletal: No acute or significant osseous findings. IMPRESSION: 1. Mild left-sided hydroureteronephrosis believed attributable to a distal left ureteral 1-2 mm calculus. 2. A nonobstructing 2 mm left lower pole renal calculus is also noted. 3. Staple line noted at the rectosigmoid. Moderate colonic stool burden without obstruction or inflammation. Minimal colonic diverticulosis without acute diverticulitis. Electronically Signed   By: Ashley Royalty M.D.   On: 05/10/2017 22:20    Procedures Procedures (including critical  care time)  Medications Ordered in ED Medications  cefTRIAXone (ROCEPHIN) 1 g in sodium chloride 0.9 % 100 mL IVPB (1 g Intravenous New Bag/Given 05/10/17 2258)  fentaNYL (SUBLIMAZE) injection 50 mcg (50 mcg Intravenous Given 05/10/17 2256)  ketorolac (TORADOL) 30 MG/ML injection 30 mg (30 mg Intravenous Given 05/10/17 2256)     Initial Impression / Assessment and Plan / ED Course  I have reviewed the triage vital signs and the nursing notes.  Pertinent labs & imaging results that were available during my care of the patient were reviewed by me and considered in my medical decision making (see chart for details).     Patient is a 52 year old female who presents with left flank pain.  CT scan shows evidence of a 1-2 mm distal left ureteral stone with some mild hydronephrosis.  She also has some associated mild constipation.  Her labs show mildly elevated white count.  She does not have any fever.  She does have signs of a urinary tract infection on UA.  She also has Trichomonas in her urine.  I discussed these findings with Dr. Louis Meckel with urology who feels given that the stone is very small and likely to pass, she would not need admission for IV antibiotics.  I did give her an IV dose of Rocephin in the ED.  Her urine is sent for culture.  I will start her on Bactrim for UTI.  I had a discussion with her about the trichomonas and that she will need to have her partner treated as well.  She was started on Flagyl for this.  She was given a prescription for Vicodin and Zofran as well as Flomax for symptomatic relief of the kidney stone.  She was given strict precautions to return to the emergency department, preferably [facility], if she has any worsening pain, fevers, ongoing vomiting or difficulty urinating.  Final Clinical Impressions(s) / ED Diagnoses   Final diagnoses:  Kidney stone  Acute cystitis with hematuria  Trichomoniasis    ED Discharge Orders        Ordered     HYDROcodone-acetaminophen (NORCO/VICODIN) 5-325 MG tablet  Every 4 hours PRN     05/10/17 2321    ondansetron (ZOFRAN ODT) 4 MG disintegrating tablet     05/10/17 2321    tamsulosin (FLOMAX) 0.4 MG CAPS capsule  Daily     05/10/17 2321    sulfamethoxazole-trimethoprim (BACTRIM DS,SEPTRA DS) 800-160 MG tablet  2 times daily     05/10/17 2321    metroNIDAZOLE (FLAGYL) 500 MG tablet  2 times daily     05/10/17 2321       Malvin Johns, MD 05/10/17 2338

## 2017-05-10 NOTE — ED Triage Notes (Signed)
L flank pain, constipation x 2 days. Using enemas without relief.

## 2017-05-12 LAB — URINE CULTURE

## 2017-05-16 ENCOUNTER — Encounter (INDEPENDENT_AMBULATORY_CARE_PROVIDER_SITE_OTHER): Payer: Self-pay | Admitting: Orthopedic Surgery

## 2017-05-16 ENCOUNTER — Ambulatory Visit (INDEPENDENT_AMBULATORY_CARE_PROVIDER_SITE_OTHER): Payer: BLUE CROSS/BLUE SHIELD | Admitting: Orthopedic Surgery

## 2017-05-16 DIAGNOSIS — M79604 Pain in right leg: Secondary | ICD-10-CM
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
## 2017-05-24 DIAGNOSIS — F900 Attention-deficit hyperactivity disorder, predominantly inattentive type: Secondary | ICD-10-CM | POA: Diagnosis not present

## 2017-05-24 DIAGNOSIS — R42 Dizziness and giddiness: Secondary | ICD-10-CM | POA: Diagnosis not present

## 2017-05-24 DIAGNOSIS — R109 Unspecified abdominal pain: Secondary | ICD-10-CM | POA: Diagnosis not present

## 2017-05-24 DIAGNOSIS — Z85038 Personal history of other malignant neoplasm of large intestine: Secondary | ICD-10-CM | POA: Diagnosis not present

## 2017-05-24 DIAGNOSIS — K219 Gastro-esophageal reflux disease without esophagitis: Secondary | ICD-10-CM | POA: Diagnosis not present

## 2017-05-24 DIAGNOSIS — E559 Vitamin D deficiency, unspecified: Secondary | ICD-10-CM | POA: Diagnosis not present

## 2017-05-24 DIAGNOSIS — M25371 Other instability, right ankle: Secondary | ICD-10-CM | POA: Diagnosis not present

## 2017-05-30 DIAGNOSIS — M7671 Peroneal tendinitis, right leg: Secondary | ICD-10-CM | POA: Diagnosis not present

## 2017-06-07 DIAGNOSIS — M25571 Pain in right ankle and joints of right foot: Secondary | ICD-10-CM | POA: Diagnosis not present

## 2017-06-16 DIAGNOSIS — M25571 Pain in right ankle and joints of right foot: Secondary | ICD-10-CM | POA: Diagnosis not present

## 2017-06-20 DIAGNOSIS — K219 Gastro-esophageal reflux disease without esophagitis: Secondary | ICD-10-CM | POA: Diagnosis not present

## 2017-06-20 DIAGNOSIS — L918 Other hypertrophic disorders of the skin: Secondary | ICD-10-CM | POA: Diagnosis not present

## 2017-06-20 DIAGNOSIS — Z85038 Personal history of other malignant neoplasm of large intestine: Secondary | ICD-10-CM | POA: Diagnosis not present

## 2017-06-20 DIAGNOSIS — E559 Vitamin D deficiency, unspecified: Secondary | ICD-10-CM | POA: Diagnosis not present

## 2017-06-21 ENCOUNTER — Ambulatory Visit (INDEPENDENT_AMBULATORY_CARE_PROVIDER_SITE_OTHER): Payer: BLUE CROSS/BLUE SHIELD | Admitting: Orthopedic Surgery

## 2017-06-21 ENCOUNTER — Encounter (INDEPENDENT_AMBULATORY_CARE_PROVIDER_SITE_OTHER): Payer: Self-pay | Admitting: Orthopedic Surgery

## 2017-06-21 VITALS — Ht 63.0 in | Wt 203.0 lb

## 2017-06-21 DIAGNOSIS — M7671 Peroneal tendinitis, right leg: Secondary | ICD-10-CM

## 2017-06-21 DIAGNOSIS — M25871 Other specified joint disorders, right ankle and foot: Secondary | ICD-10-CM | POA: Diagnosis not present

## 2017-06-21 MED ORDER — LIDOCAINE HCL 1 % IJ SOLN
2.0000 mL | INTRAMUSCULAR | Status: AC | PRN
  Administered 2017-06-21: 2 mL

## 2017-06-21 MED ORDER — METHYLPREDNISOLONE ACETATE 40 MG/ML IJ SUSP
40.0000 mg | INTRAMUSCULAR | Status: AC | PRN
  Administered 2017-06-21: 40 mg via INTRA_ARTICULAR
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
## 2017-06-22 ENCOUNTER — Ambulatory Visit (INDEPENDENT_AMBULATORY_CARE_PROVIDER_SITE_OTHER): Payer: BLUE CROSS/BLUE SHIELD | Admitting: Orthopedic Surgery

## 2017-06-24 ENCOUNTER — Telehealth (INDEPENDENT_AMBULATORY_CARE_PROVIDER_SITE_OTHER): Payer: Self-pay | Admitting: *Deleted

## 2017-06-24 ENCOUNTER — Other Ambulatory Visit (INDEPENDENT_AMBULATORY_CARE_PROVIDER_SITE_OTHER): Payer: Self-pay | Admitting: Physical Medicine and Rehabilitation

## 2017-06-24 MED ORDER — DIAZEPAM 5 MG PO TABS: ORAL_TABLET | ORAL | 0 refills | Status: AC

## 2017-06-24 NOTE — Telephone Encounter (Signed)
done

## 2017-06-27 NOTE — Telephone Encounter (Signed)
Called pt informing her that rx has been sent.

## 2017-06-29 ENCOUNTER — Encounter (INDEPENDENT_AMBULATORY_CARE_PROVIDER_SITE_OTHER): Payer: Self-pay | Admitting: Physical Medicine and Rehabilitation

## 2017-06-30 ENCOUNTER — Ambulatory Visit (INDEPENDENT_AMBULATORY_CARE_PROVIDER_SITE_OTHER): Payer: Self-pay | Admitting: Physical Medicine and Rehabilitation

## 2017-07-02 ENCOUNTER — Other Ambulatory Visit: Payer: Self-pay

## 2017-07-02 ENCOUNTER — Encounter (HOSPITAL_BASED_OUTPATIENT_CLINIC_OR_DEPARTMENT_OTHER): Payer: Self-pay | Admitting: Emergency Medicine

## 2017-07-02 ENCOUNTER — Emergency Department (HOSPITAL_BASED_OUTPATIENT_CLINIC_OR_DEPARTMENT_OTHER)
Admission: EM | Admit: 2017-07-02 | Discharge: 2017-07-02 | Disposition: A | Discharge status: Home / Self Care | Payer: BLUE CROSS/BLUE SHIELD | Attending: Emergency Medicine | Admitting: Emergency Medicine

## 2017-07-02 ENCOUNTER — Emergency Department (HOSPITAL_BASED_OUTPATIENT_CLINIC_OR_DEPARTMENT_OTHER): Payer: BLUE CROSS/BLUE SHIELD

## 2017-07-02 DIAGNOSIS — Z85038 Personal history of other malignant neoplasm of large intestine: Secondary | ICD-10-CM | POA: Insufficient documentation

## 2017-07-02 DIAGNOSIS — Y92015 Private garage of single-family (private) house as the place of occurrence of the external cause: Secondary | ICD-10-CM | POA: Diagnosis not present

## 2017-07-02 DIAGNOSIS — Y9389 Activity, other specified: Secondary | ICD-10-CM | POA: Diagnosis not present

## 2017-07-02 DIAGNOSIS — Y999 Unspecified external cause status: Secondary | ICD-10-CM | POA: Insufficient documentation

## 2017-07-02 DIAGNOSIS — Z79899 Other long term (current) drug therapy: Secondary | ICD-10-CM | POA: Insufficient documentation

## 2017-07-02 DIAGNOSIS — W01190A Fall on same level from slipping, tripping and stumbling with subsequent striking against furniture, initial encounter: Secondary | ICD-10-CM | POA: Diagnosis not present

## 2017-07-02 DIAGNOSIS — S8992XA Unspecified injury of left lower leg, initial encounter: Secondary | ICD-10-CM | POA: Diagnosis not present

## 2017-07-02 DIAGNOSIS — M79605 Pain in left leg: Secondary | ICD-10-CM | POA: Diagnosis not present

## 2017-07-02 DIAGNOSIS — S93402A Sprain of unspecified ligament of left ankle, initial encounter: Secondary | ICD-10-CM | POA: Insufficient documentation

## 2017-07-02 DIAGNOSIS — S99812A Other specified injuries of left ankle, initial encounter: Secondary | ICD-10-CM | POA: Diagnosis not present

## 2017-07-02 NOTE — ED Notes (Signed)
Patient was given information and education on crutches and the aso to wear for the next couple days until her cruise. Pt then will switch to using the Cam walker for her cruise. Pt was educated on how to use the cam walker. CMS is in tact before and after the aso was applied.

## 2017-07-02 NOTE — ED Triage Notes (Signed)
Patient states that she fell this am and an old antique table fell onto her left ankle  - noted swelling to her left ankle  - patient walks with a limp

## 2017-07-02 NOTE — ED Notes (Signed)
Pt states she fell in the garage (lost her balance) and a table fell on her left ankle. Moves toes. Feels touch. Cap refill < 3 sec.

## 2017-07-02 NOTE — ED Notes (Signed)
Pt given d/c instructions as per chart. Verbalizes understanding. No questions. 

## 2017-07-02 NOTE — Discharge Instructions (Signed)
Follow up with your Primary Care Doctor as needed.  ° °Follow up with referred orthopedic doctor in 1-2 weeks if no improvement of pain.  ° °You can take Tylenol or Ibuprofen as directed for pain. You can alternate Tylenol and Ibuprofen every 4 hours. If you take Tylenol at 1pm, then you can take Ibuprofen at 5pm. Then you can take Tylenol again at 9pm. Do not exceed 4000 mg of tylenol a day. Do not exceed 800 mg of ibuprofen a day.  ° °  °Return to the Emergency Department immediately for any worsening pain, redness/swelling of the ankle, gray or blue color to the toes, numbness/weakness of toes or foot, difficulty walking or any other worsening or concerning symptoms.  ° ° °Ankle sprain °Ankle sprain occurs when the ligaments that hold the ankle joint to get her are stretched or torn. It may take 4-6 weeks to heal. ° °For activity: Use crutches with nonweightbearing for the first few days. Then, you may walk on your ankles as the pain allows, or as instructed. Start gradually with weight bearing on the affected ankle. Once you can walk pain free, then try jogging. When you can run forwards, then you can try moving side to side. If you cannot walk without crutches in one week, you need a recheck by your Family Doctor. ° °If you do not have a family doctor to followup with, you can see the list of phone numbers below. Please call today to make a followup appointment. ° ° °RICE therapy:  Routine Care for injuries ° °Rest, Ice, Compression, Elevation (RICE) ° °Rest is needed to allow your body to heal. Routine activities can be resumed when comfortable. Injury tendons and bones can take up to 6 weeks to heal. Tendons are cordlike structures that attach muscles and bones. ° °Ice following an injury helps keep the swelling down and reduce the pain. Put ice in a plastic bag. Place a towel between your skin and the bag of ice. Leave the ice on for 15-20 minutes, 3-4 times a day. Do this while awake, for the first 24-48  hours. After that continue as directed by your caregiver. ° °Compression helps keep swelling down. It also gives support and helps with discomfort. If any lasting bandage has been applied, it should be removed and reapplied every 3-4 hours. It should not be applied tightly, but firmly enough to keep swelling down. Watch fingers or toes for swelling, discoloration, coldness, numbness or excessive pain. If any of these problems occur, removed the bandage and reapply loosely. Contact your caregiver if these problems continue. ° °Elevation helps reduce swelling and decrease your pain. With extremities such as the arms, hands, legs and feet, the injured area should be placed near or above the level of the heart if possible. ° ° ° °

## 2017-07-02 NOTE — ED Provider Notes (Signed)
[facility] EMERGENCY DEPARTMENT Provider Note   CSN: [id number] Arrival date & time: 07/02/17  1754     History   Chief Complaint Chief Complaint  Patient presents with  . Ankle Pain    HPI Glenda Hudson is a 52 y.o. female resents for evaluation of left ankle pain that began this afternoon.  Patient reports that she was walking in her garage when she tripped over the table, causing her to twist her ankle.  Patient does not recall if it was an inversion or eversion injury.  Patient also reports that the table fell on her leg.  Patient reports difficulty ambulating and bearing weight on the left lower extremity since the incident.  Associated soft tissue swelling.  Patient reports that she has taken [place] which she had at home for her symptoms which slightly improved her pain.  Patient denies any fevers, numbness/weakness.  The history is provided by the patient.    Past Medical History:  Diagnosis Date  . Allergy   . Cancer ([place])   . Colon cancer [facility])     Patient Active Problem List   Diagnosis Date Noted  . Anxiety and depression 01/05/2011  . Arm pain 01/05/2011  . 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 OBESITY 02/16/2010  . Allergic rhinitis, cause unspecified 02/16/2010  . PLANTAR FASCIITIS, BILATERAL 02/16/2010  . FATIGUE 02/16/2010  . SNORING 02/16/2010    Past Surgical History:  Procedure Laterality Date  . COLON SURGERY    . TUBAL LIGATION    . TUBAL LIGATION       OB History   None      Home Medications    Prior to Admission medications   Medication Sig Start Date End Date Taking? Authorizing Provider  amphetamine-dextroamphetamine (ADDERALL) 15 MG tablet Take 1 tablet by mouth 2 (two) times daily. 12/31/16   [provider]  cholecalciferol (VITAMIN D) 1000 UNITS tablet Take 1,000 Units by mouth daily.    [provider]  citalopram (CELEXA) 20 MG tablet Take 1 tablet (20 mg total) by mouth daily. 01/05/11 01/05/12  Midge Minium, MD  cyclobenzaprine (FLEXERIL) 10 MG tablet Take 1 tablet (10 mg total) by mouth 3 (three) times daily as needed for muscle spasms. 01/03/13   Evelina Bucy, MD  diazepam (VALIUM) 5 MG tablet Take 1 by mouth 1 to 2 hours pre-procedure. May repeat if necessary. 06/24/17   Magnus Sinning, MD  HYDROcodone-acetaminophen (NORCO/VICODIN) 5-325 MG tablet Take 1-2 tablets by mouth every 4 (four) hours as needed. 05/10/17   Malvin Johns, MD  hydrOXYzine (VISTARIL) 25 MG capsule Take 25 mg by mouth at bedtime. 03/10/17   [provider]  ibuprofen (ADVIL,MOTRIN) 600 MG tablet Take 1 tablet (600 mg total) by mouth every 6 (six) hours as needed for pain. 11/23/11   Mabe, Forbes Cellar, MD  meloxicam (MOBIC) 15 MG tablet Take 15 mg by mouth daily. 02/25/17   [provider]  methocarbamol (ROBAXIN) 500 MG tablet Take 1 tablet (500 mg total) by mouth 4 (four) times daily. 11/14/14   Carlisle Cater, PA-C  metroNIDAZOLE (FLAGYL) 500 MG tablet Take 1 tablet (500 mg total) by mouth 2 (two) times daily. One po bid x 7 days 05/10/17   Malvin Johns, MD  naproxen (NAPROSYN) 500 MG tablet Take 1 tablet (500 mg total) by mouth 2 (two) times daily. 10/19/15   Rancour, Annie Main, MD  nortriptyline (PAMELOR) 25 MG capsule Take 25 mg by mouth at bedtime.  [provider]  ondansetron (ZOFRAN ODT) 4 MG disintegrating tablet 4mg  ODT q4 hours prn nausea/vomit 05/10/17   Malvin Johns, MD  oxyCODONE-acetaminophen (PERCOCET/ROXICET) 5-325 MG per tablet Take 1 tablet by mouth every 6 (six) hours as needed for pain. 05/15/12   Blanchie Dessert, MD  PHENTERMINE HCL PO Take by mouth.    [provider]  tamsulosin (FLOMAX) 0.4 MG CAPS capsule Take 1 capsule (0.4 mg total) by mouth daily. 05/10/17   Malvin Johns, MD    Family History Family History  Problem Relation Age of Onset  . Asthma Mother   . Asthma Brother   . Emphysema Maternal Grandfather     Social History Social History    Tobacco Use  . Smoking status: Never Smoker  . Smokeless tobacco: Never Used  Substance Use Topics  . Alcohol use: No  . Drug use: No     Allergies   Amoxicillin   Review of Systems Review of Systems  Musculoskeletal:       Left ankle pain  Neurological: Negative for weakness and numbness.     Physical Exam Updated Vital Signs BP 137/90 (BP Location: Left Arm)   Pulse 77   Temp 98.8 F (37.1 C) (Oral)   Resp 18   Ht 5\' 3"  (1.6 m)   Wt 90.3 kg (199 lb)   SpO2 100%   BMI 35.25 kg/m   Physical Exam  Constitutional: She appears well-developed and well-nourished.  HENT:  Head: Normocephalic and atraumatic.  Eyes: EOM are normal.  Neck: Normal range of motion.  Cardiovascular:  Pulses:      Dorsalis pedis pulses are 2+ on the right side, and 2+ on the left side.  Pulmonary/Chest: Effort normal.  Musculoskeletal:  Tenderness to palpation to the medial malleolus of the left ankle extends to the posterior region.  She is overlying soft tissue swelling, ecchymosis.  No deformity, crepitus. No overlying warmth, or erythema.  Dorsiflexion and plantar flexion intact but with subjective reports of pain.  Patient able to move all 5 digits of left foot without any difficulty.  No tenderness palpation noted to proximal tib-fib.  Diffuse tenderness palpation noted to distal tib-fib.  No deformity or crepitus noted.  No abnormalities of the right lower extremity.  No tenderness palpation to left knee.  Neurological:  Sensation intact throughout all major nerve distributions of the feet   Skin: Skin is warm and dry. Capillary refill takes less than 2 seconds.  The skin is intact to ankle/foot.  The foot is warm and well perfused with intact sensation  Nursing note and vitals reviewed.    ED Treatments / Results  Labs (all labs ordered are listed, but only abnormal results are displayed) Labs Reviewed - No data to display  EKG None  Radiology Dg Tibia/fibula  Left  Result Date: 07/02/2017 CLINICAL DATA:  Table fell on leg, painful EXAM: LEFT TIBIA AND FIBULA - 2 VIEW COMPARISON:  None. FINDINGS: There is no evidence of fracture or other focal bone lesions. Soft tissues swelling is present. IMPRESSION: No acute osseous abnormality Electronically Signed   By: Donavan Foil M.D.   On: 07/02/2017 18:58   Dg Ankle Complete Left  Result Date: 07/02/2017 CLINICAL DATA:  Table fell on leg with pain EXAM: LEFT ANKLE COMPLETE - 3+ VIEW COMPARISON:  None. FINDINGS: No fracture or malalignment. Ankle mortise is symmetric. Prominent posterior calcaneal spurring. Small plantar calcaneal spur. Soft tissue swelling. IMPRESSION: No acute osseous abnormality Electronically Signed  By: Donavan Foil M.D.   On: 07/02/2017 18:56    Procedures Procedures (including critical care time)  Medications Ordered in ED Medications - No data to display   Initial Impression / Assessment and Plan / ED Course  I have reviewed the triage vital signs and the nursing notes.  Pertinent labs & imaging results that were available during my care of the patient were reviewed by me and considered in my medical decision making (see chart for details).     52 year old female who presents for evaluation of left ankle pain after twisting her ankle and having a table fall on her leg.  Reports difficulty ambulating, bearing weight since the incident.  Took Norco with some improvement in her pain.  Reports some associated soft tissue swelling.  No warmth, erythema.  No numbness/weakness. Patient is afebrile, non-toxic appearing, sitting comfortably on examination table. Vital signs reviewed and stable.  Patient is neurovascularly intact.  On exam, patient has tenderness noted to the medial malleolus of the left ankle that wraps around posteriorly.  There is some overlying soft tissue swelling, ecchymosis.  No deformity or crepitus noted.  Plantarflexion and dorsiflexion intact but with some pain.   Consider sprain versus fracture versus dislocation.  X-rays ordered at triage.  X-ray of ankle reviewed.  Negative for any acute fracture dislocation.  On review of x-ray, there is a posterior abnormality that they are reading as a calcaneal spur.  No evidence of acute fracture.  Tib-fib x-ray reviewed.  Negative for any acute fracture dislocation.  Discussed results with patient.  We will plan to treat as ankle sprain.  Patient placed in ASO splint and provided crutches here in the ED.  She is getting ready to go on a cruise in the next 4 days.  We will plan to send home a cam walker boot for support and stabilization during her cruise.  Supportive at home measures discussed with patient.  Instructed orthopedic follow-up if she does not have any improvement in the next 1 to 2 weeks. Patient had ample opportunity for questions and discussion. All patient's questions were answered with full understanding. Strict return precautions discussed. Patient expresses understanding and agreement to plan.    Final Clinical Impressions(s) / ED Diagnoses   Final diagnoses:  Sprain of left ankle, unspecified ligament, initial encounter    ED Discharge Orders    None       Volanda Napoleon, PA-C 07/02/17 2328    Malvin Johns, MD 07/03/17 0002

## 2017-07-02 NOTE — ED Notes (Signed)
Attempted to sign x 2 . Signature pad not working.

## 2017-07-04 ENCOUNTER — Ambulatory Visit: Payer: BLUE CROSS/BLUE SHIELD | Admitting: Family Medicine

## 2017-07-04 DIAGNOSIS — K219 Gastro-esophageal reflux disease without esophagitis: Secondary | ICD-10-CM | POA: Diagnosis not present

## 2017-07-04 DIAGNOSIS — E559 Vitamin D deficiency, unspecified: Secondary | ICD-10-CM | POA: Diagnosis not present

## 2017-07-04 DIAGNOSIS — Z85038 Personal history of other malignant neoplasm of large intestine: Secondary | ICD-10-CM | POA: Diagnosis not present

## 2017-07-04 DIAGNOSIS — M25572 Pain in left ankle and joints of left foot: Secondary | ICD-10-CM | POA: Diagnosis not present

## 2017-07-13 DIAGNOSIS — S93492A Sprain of other ligament of left ankle, initial encounter: Secondary | ICD-10-CM | POA: Diagnosis not present

## 2017-07-13 DIAGNOSIS — M25572 Pain in left ankle and joints of left foot: Secondary | ICD-10-CM | POA: Diagnosis not present

## 2017-07-18 ENCOUNTER — Encounter (INDEPENDENT_AMBULATORY_CARE_PROVIDER_SITE_OTHER): Payer: Self-pay | Admitting: Physical Medicine and Rehabilitation

## 2017-07-21 ENCOUNTER — Telehealth (INDEPENDENT_AMBULATORY_CARE_PROVIDER_SITE_OTHER): Payer: Self-pay | Admitting: Physical Medicine and Rehabilitation

## 2017-07-21 ENCOUNTER — Ambulatory Visit (INDEPENDENT_AMBULATORY_CARE_PROVIDER_SITE_OTHER): Payer: BLUE CROSS/BLUE SHIELD | Admitting: Orthopedic Surgery

## 2017-07-21 NOTE — Telephone Encounter (Signed)
Patient would like to cancel appt for 6/3. She said she doesn't want to proceed with the hip injections anymore because shes afraid.   She would like to know if theres another route for her besides injections.

## 2017-07-22 ENCOUNTER — Other Ambulatory Visit (INDEPENDENT_AMBULATORY_CARE_PROVIDER_SITE_OTHER): Payer: Self-pay

## 2017-07-22 DIAGNOSIS — E559 Vitamin D deficiency, unspecified: Secondary | ICD-10-CM | POA: Diagnosis not present

## 2017-07-22 DIAGNOSIS — M25572 Pain in left ankle and joints of left foot: Secondary | ICD-10-CM | POA: Diagnosis not present

## 2017-07-22 DIAGNOSIS — Z85038 Personal history of other malignant neoplasm of large intestine: Secondary | ICD-10-CM | POA: Diagnosis not present

## 2017-07-22 DIAGNOSIS — K219 Gastro-esophageal reflux disease without esophagitis: Secondary | ICD-10-CM | POA: Diagnosis not present

## 2017-07-22 NOTE — Telephone Encounter (Signed)
I called pt to advise of message below and she asked if these could be written out and mailed to her. Her father just passed away today and she states that "my head is all over the place" offering condolences I advised that I will put this in the mail for her today. Advised to take care of herself and her family and we will be here to answer any questions that she may have or help facilitate the option she decided to go with.

## 2017-07-22 NOTE — Telephone Encounter (Signed)
Please advise 

## 2017-07-22 NOTE — Telephone Encounter (Signed)
Injections or therapy or waiting it out - all appropriate imaging has been done - those would be the options

## 2017-07-27 NOTE — Telephone Encounter (Signed)
Cancelled appt 08/01/17

## 2017-08-01 ENCOUNTER — Ambulatory Visit (INDEPENDENT_AMBULATORY_CARE_PROVIDER_SITE_OTHER): Payer: Self-pay | Admitting: Physical Medicine and Rehabilitation

## 2017-08-02 DIAGNOSIS — Z85038 Personal history of other malignant neoplasm of large intestine: Secondary | ICD-10-CM | POA: Diagnosis not present

## 2017-08-02 DIAGNOSIS — E559 Vitamin D deficiency, unspecified: Secondary | ICD-10-CM | POA: Diagnosis not present

## 2017-08-02 DIAGNOSIS — K219 Gastro-esophageal reflux disease without esophagitis: Secondary | ICD-10-CM | POA: Diagnosis not present

## 2017-08-02 DIAGNOSIS — M25572 Pain in left ankle and joints of left foot: Secondary | ICD-10-CM | POA: Diagnosis not present

## 2017-08-05 DIAGNOSIS — F411 Generalized anxiety disorder: Secondary | ICD-10-CM | POA: Diagnosis not present

## 2017-08-05 DIAGNOSIS — S93492D Sprain of other ligament of left ankle, subsequent encounter: Secondary | ICD-10-CM | POA: Diagnosis not present

## 2017-08-11 DIAGNOSIS — M25572 Pain in left ankle and joints of left foot: Secondary | ICD-10-CM | POA: Diagnosis not present

## 2017-08-12 DIAGNOSIS — F411 Generalized anxiety disorder: Secondary | ICD-10-CM | POA: Diagnosis not present

## 2017-08-12 DIAGNOSIS — M7662 Achilles tendinitis, left leg: Secondary | ICD-10-CM | POA: Diagnosis not present

## 2017-09-06 ENCOUNTER — Telehealth (INDEPENDENT_AMBULATORY_CARE_PROVIDER_SITE_OTHER): Payer: Self-pay

## 2017-09-06 NOTE — Telephone Encounter (Signed)
Patient was seen for OV on 05/16/17. Per that last OV note your plan stated  Plan: Impression is fairly normal-appearing MRI scans.  She is get a little bit of disc bulging at L4 and L5 but no real neuroforaminal compromise and no acute appearing injury.  Think she may have pain which is not really explained by the MRI findings.  She is failed conservative measures.  I would favor diagnostic and therapeutic right hip injection followed by diagnostic and therapeutic lumbar spine injection after 2 weeks.  Hopefully that can help differentiate where some of her pain symptoms are coming from.  I do think is highly likely that she will have to wait this out as there is no definitive surgical indication based on the scans at this time.   Received note from Dr Romona Curls assistant stating patient was refusing injection but is requesting a note from you stating how her quality of life is due to her condition.  Please advise.

## 2017-09-07 NOTE — Telephone Encounter (Signed)
I called patient and discussed . She stated that she is afraid of injection in hip and back because she previously had an Ankle injection only 1 day of relief.  She stated her Lawyer suggested that she needs a letter stating that she could not have the injection and would continue to be in pain. Spoke with patient at length and I advised patient per Dr Marlou Sa. She verbalized understanding.

## 2017-09-07 NOTE — Telephone Encounter (Signed)
We will just let the medical records stand on its own.  The quality of life evaluation would be highly subjective.

## 2017-09-27 DIAGNOSIS — F411 Generalized anxiety disorder: Secondary | ICD-10-CM | POA: Diagnosis not present

## 2017-10-13 DIAGNOSIS — M542 Cervicalgia: Secondary | ICD-10-CM | POA: Diagnosis not present

## 2017-10-13 DIAGNOSIS — E559 Vitamin D deficiency, unspecified: Secondary | ICD-10-CM | POA: Diagnosis not present

## 2017-11-10 DIAGNOSIS — K219 Gastro-esophageal reflux disease without esophagitis: Secondary | ICD-10-CM | POA: Diagnosis not present

## 2017-11-10 DIAGNOSIS — M542 Cervicalgia: Secondary | ICD-10-CM | POA: Diagnosis not present

## 2017-11-16 DIAGNOSIS — M545 Low back pain: Secondary | ICD-10-CM | POA: Diagnosis not present

## 2017-11-22 DIAGNOSIS — M545 Low back pain: Secondary | ICD-10-CM | POA: Diagnosis not present

## 2017-11-22 DIAGNOSIS — M542 Cervicalgia: Secondary | ICD-10-CM | POA: Diagnosis not present

## 2017-12-01 DIAGNOSIS — M542 Cervicalgia: Secondary | ICD-10-CM | POA: Diagnosis not present

## 2017-12-01 DIAGNOSIS — M545 Low back pain: Secondary | ICD-10-CM | POA: Diagnosis not present

## 2017-12-13 DIAGNOSIS — M542 Cervicalgia: Secondary | ICD-10-CM | POA: Diagnosis not present

## 2017-12-13 DIAGNOSIS — M545 Low back pain: Secondary | ICD-10-CM | POA: Diagnosis not present

## 2017-12-20 DIAGNOSIS — M545 Low back pain: Secondary | ICD-10-CM | POA: Diagnosis not present

## 2017-12-20 DIAGNOSIS — M542 Cervicalgia: Secondary | ICD-10-CM | POA: Diagnosis not present

## 2017-12-29 DIAGNOSIS — K219 Gastro-esophageal reflux disease without esophagitis: Secondary | ICD-10-CM | POA: Diagnosis not present

## 2017-12-29 DIAGNOSIS — H65112 Acute and subacute allergic otitis media (mucoid) (sanguinous) (serous), left ear: Secondary | ICD-10-CM | POA: Diagnosis not present

## 2017-12-29 DIAGNOSIS — E559 Vitamin D deficiency, unspecified: Secondary | ICD-10-CM | POA: Diagnosis not present

## 2017-12-29 DIAGNOSIS — M542 Cervicalgia: Secondary | ICD-10-CM | POA: Diagnosis not present

## 2018-01-03 DIAGNOSIS — M545 Low back pain: Secondary | ICD-10-CM | POA: Diagnosis not present

## 2018-01-03 DIAGNOSIS — M542 Cervicalgia: Secondary | ICD-10-CM | POA: Diagnosis not present

## 2018-01-10 DIAGNOSIS — M545 Low back pain: Secondary | ICD-10-CM | POA: Diagnosis not present

## 2018-01-10 DIAGNOSIS — M542 Cervicalgia: Secondary | ICD-10-CM | POA: Diagnosis not present

## 2018-01-11 DIAGNOSIS — E6609 Other obesity due to excess calories: Secondary | ICD-10-CM | POA: Diagnosis not present

## 2018-01-12 DIAGNOSIS — M542 Cervicalgia: Secondary | ICD-10-CM | POA: Diagnosis not present

## 2018-01-12 DIAGNOSIS — F900 Attention-deficit hyperactivity disorder, predominantly inattentive type: Secondary | ICD-10-CM | POA: Diagnosis not present

## 2018-01-12 DIAGNOSIS — K219 Gastro-esophageal reflux disease without esophagitis: Secondary | ICD-10-CM | POA: Diagnosis not present

## 2018-01-19 DIAGNOSIS — M545 Low back pain: Secondary | ICD-10-CM | POA: Diagnosis not present

## 2018-01-19 DIAGNOSIS — M542 Cervicalgia: Secondary | ICD-10-CM | POA: Diagnosis not present

## 2018-02-16 DIAGNOSIS — M542 Cervicalgia: Secondary | ICD-10-CM | POA: Diagnosis not present

## 2018-02-16 DIAGNOSIS — M545 Low back pain: Secondary | ICD-10-CM | POA: Diagnosis not present

## 2018-02-21 DIAGNOSIS — F900 Attention-deficit hyperactivity disorder, predominantly inattentive type: Secondary | ICD-10-CM | POA: Diagnosis not present

## 2018-02-21 DIAGNOSIS — K219 Gastro-esophageal reflux disease without esophagitis: Secondary | ICD-10-CM | POA: Diagnosis not present

## 2018-02-21 DIAGNOSIS — M542 Cervicalgia: Secondary | ICD-10-CM | POA: Diagnosis not present

## 2018-02-21 DIAGNOSIS — E559 Vitamin D deficiency, unspecified: Secondary | ICD-10-CM | POA: Diagnosis not present

## 2018-03-19 DIAGNOSIS — M545 Low back pain: Secondary | ICD-10-CM | POA: Diagnosis not present

## 2018-03-19 DIAGNOSIS — M542 Cervicalgia: Secondary | ICD-10-CM | POA: Diagnosis not present

## 2018-04-19 DIAGNOSIS — M545 Low back pain: Secondary | ICD-10-CM | POA: Diagnosis not present

## 2018-04-19 DIAGNOSIS — M542 Cervicalgia: Secondary | ICD-10-CM | POA: Diagnosis not present

## 2018-05-18 DIAGNOSIS — M545 Low back pain: Secondary | ICD-10-CM | POA: Diagnosis not present

## 2018-05-18 DIAGNOSIS — M542 Cervicalgia: Secondary | ICD-10-CM | POA: Diagnosis not present

## 2018-11-10 ENCOUNTER — Encounter (HOSPITAL_BASED_OUTPATIENT_CLINIC_OR_DEPARTMENT_OTHER): Payer: Self-pay

## 2018-11-10 ENCOUNTER — Other Ambulatory Visit: Payer: Self-pay

## 2018-11-10 DIAGNOSIS — Z85038 Personal history of other malignant neoplasm of large intestine: Secondary | ICD-10-CM | POA: Insufficient documentation

## 2018-11-10 DIAGNOSIS — Z880 Allergy status to penicillin: Secondary | ICD-10-CM | POA: Diagnosis not present

## 2018-11-10 DIAGNOSIS — S161XXA Strain of muscle, fascia and tendon at neck level, initial encounter: Secondary | ICD-10-CM | POA: Diagnosis not present

## 2018-11-10 DIAGNOSIS — Y92410 Unspecified street and highway as the place of occurrence of the external cause: Secondary | ICD-10-CM | POA: Insufficient documentation

## 2018-11-10 DIAGNOSIS — Y999 Unspecified external cause status: Secondary | ICD-10-CM | POA: Insufficient documentation

## 2018-11-10 DIAGNOSIS — S29012A Strain of muscle and tendon of back wall of thorax, initial encounter: Secondary | ICD-10-CM | POA: Insufficient documentation

## 2018-11-10 DIAGNOSIS — S299XXA Unspecified injury of thorax, initial encounter: Secondary | ICD-10-CM | POA: Diagnosis not present

## 2018-11-10 DIAGNOSIS — Y9389 Activity, other specified: Secondary | ICD-10-CM | POA: Diagnosis not present

## 2018-11-10 DIAGNOSIS — M79641 Pain in right hand: Secondary | ICD-10-CM | POA: Diagnosis not present

## 2018-11-10 DIAGNOSIS — S63501A Unspecified sprain of right wrist, initial encounter: Secondary | ICD-10-CM | POA: Diagnosis not present

## 2018-11-10 DIAGNOSIS — M546 Pain in thoracic spine: Secondary | ICD-10-CM | POA: Diagnosis not present

## 2018-11-10 DIAGNOSIS — M542 Cervicalgia: Secondary | ICD-10-CM | POA: Diagnosis not present

## 2018-11-10 DIAGNOSIS — Z79899 Other long term (current) drug therapy: Secondary | ICD-10-CM | POA: Insufficient documentation

## 2018-11-10 DIAGNOSIS — S199XXA Unspecified injury of neck, initial encounter: Secondary | ICD-10-CM | POA: Diagnosis not present
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
## 2018-11-11 ENCOUNTER — Emergency Department (HOSPITAL_BASED_OUTPATIENT_CLINIC_OR_DEPARTMENT_OTHER): Payer: BLUE CROSS/BLUE SHIELD

## 2018-11-11 ENCOUNTER — Emergency Department (HOSPITAL_BASED_OUTPATIENT_CLINIC_OR_DEPARTMENT_OTHER)
Admission: EM | Admit: 2018-11-11 | Discharge: 2018-11-11 | Disposition: A | Discharge status: Home / Self Care | Payer: BLUE CROSS/BLUE SHIELD | Attending: Emergency Medicine | Admitting: Emergency Medicine

## 2018-11-11 DIAGNOSIS — M79641 Pain in right hand: Secondary | ICD-10-CM | POA: Diagnosis not present

## 2018-11-11 DIAGNOSIS — M542 Cervicalgia: Secondary | ICD-10-CM | POA: Diagnosis not present

## 2018-11-11 DIAGNOSIS — S63501A Unspecified sprain of right wrist, initial encounter: Secondary | ICD-10-CM

## 2018-11-11 DIAGNOSIS — S299XXA Unspecified injury of thorax, initial encounter: Secondary | ICD-10-CM | POA: Diagnosis not present

## 2018-11-11 DIAGNOSIS — S29012A Strain of muscle and tendon of back wall of thorax, initial encounter: Secondary | ICD-10-CM

## 2018-11-11 DIAGNOSIS — S199XXA Unspecified injury of neck, initial encounter: Secondary | ICD-10-CM | POA: Diagnosis not present

## 2018-11-11 DIAGNOSIS — S161XXA Strain of muscle, fascia and tendon at neck level, initial encounter: Secondary | ICD-10-CM

## 2018-11-11 DIAGNOSIS — M546 Pain in thoracic spine: Secondary | ICD-10-CM | POA: Diagnosis not present

## 2018-11-11 MED ORDER — IBUPROFEN 800 MG PO TABS
800.0000 mg | ORAL_TABLET | Freq: Once | ORAL | Status: AC
  Administered 2018-11-11: 800 mg via ORAL
  Filled 2018-11-11: qty 1

## 2018-11-11 MED ORDER — IBUPROFEN 800 MG PO TABS: 800.0000 mg | ORAL_TABLET | Freq: Three times a day (TID) | ORAL | 0 refills | Status: AC | PRN
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
## 2018-11-11 NOTE — ED Notes (Signed)
ED Provider at bedside. 

## 2018-11-11 NOTE — ED Notes (Signed)
Patient transported to X-ray 

## 2019-01-09 DIAGNOSIS — K219 Gastro-esophageal reflux disease without esophagitis: Secondary | ICD-10-CM | POA: Diagnosis not present

## 2019-01-09 DIAGNOSIS — Z136 Encounter for screening for cardiovascular disorders: Secondary | ICD-10-CM | POA: Diagnosis not present

## 2019-01-09 DIAGNOSIS — Z85038 Personal history of other malignant neoplasm of large intestine: Secondary | ICD-10-CM | POA: Diagnosis not present

## 2019-01-09 DIAGNOSIS — F419 Anxiety disorder, unspecified: Secondary | ICD-10-CM | POA: Diagnosis not present

## 2019-01-09 DIAGNOSIS — Z0000 Encounter for general adult medical examination without abnormal findings: Secondary | ICD-10-CM | POA: Diagnosis not present

## 2019-01-09 DIAGNOSIS — Z131 Encounter for screening for diabetes mellitus: Secondary | ICD-10-CM | POA: Diagnosis not present

## 2019-01-09 DIAGNOSIS — E559 Vitamin D deficiency, unspecified: Secondary | ICD-10-CM | POA: Diagnosis not present

## 2019-10-31 IMAGING — CT CT RENAL STONE PROTOCOL
2 of 4 series · 16 of 46 positions shown, 18 images · non-contrast
Comparison: Lumbar spine radiographs which includes pelvis from
05/15/2012

CLINICAL DATA: Left flank and abdominal pain with constipation x2
days. Some hematuria. History of colon cancer in 7507.

EXAM:
CT ABDOMEN AND PELVIS WITHOUT CONTRAST
TECHNIQUE: Multidetector CT imaging of the abdomen and pelvis was performed
following the standard protocol without IV contrast.

[Series 2: axial st · axial · 0.87mm/px · z∈[-487,-42]mm · 13 of 99 slices shown, 15 images]
[im 5/99  soft-tissue]
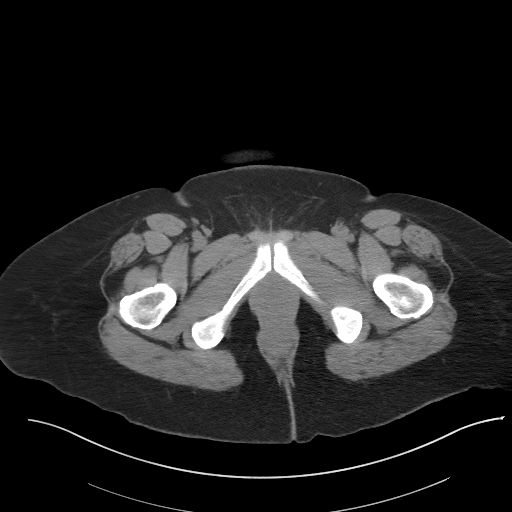
[im 5/99  bone]
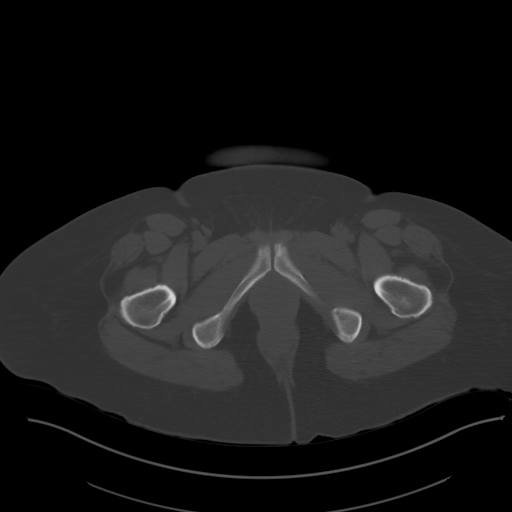
[im 13/99  soft-tissue]
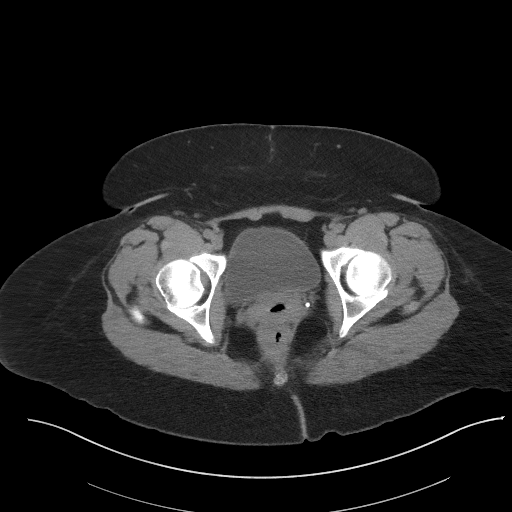
[im 21/99  soft-tissue]
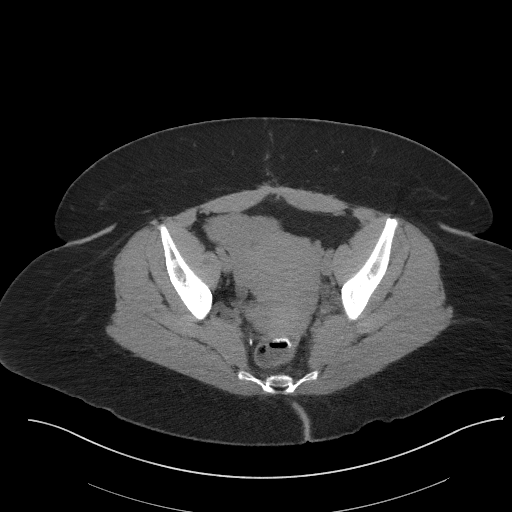
[im 29/99  soft-tissue]
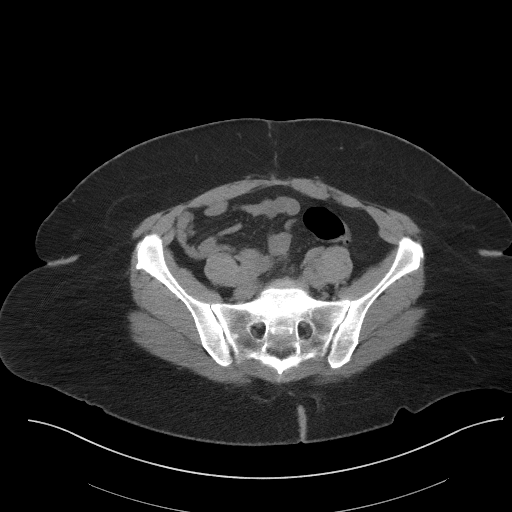
[im 33/99  soft-tissue]
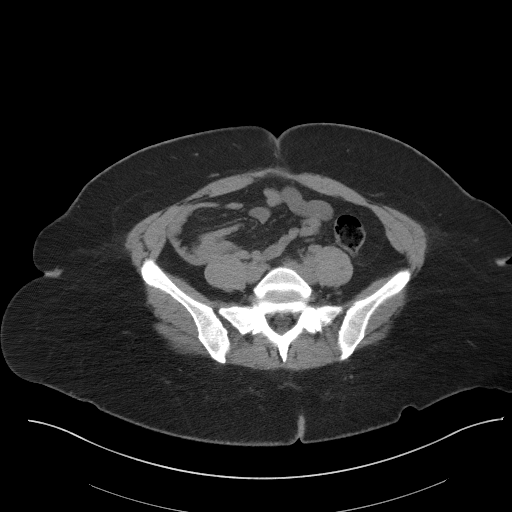
[im 41/99  soft-tissue]
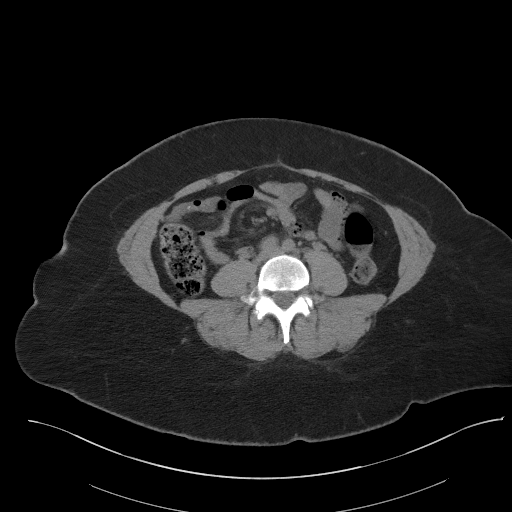
[im 50/99  soft-tissue]
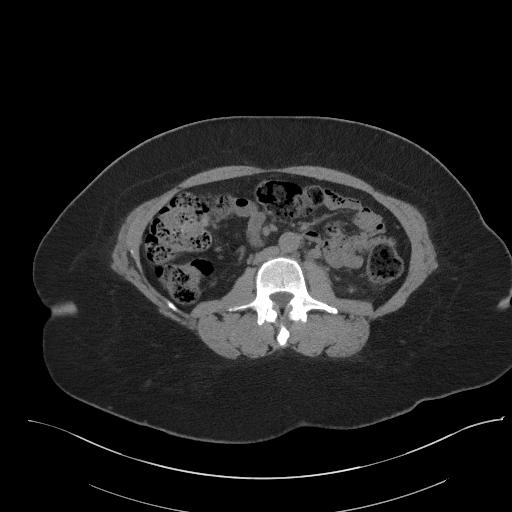
[im 58/99  soft-tissue]
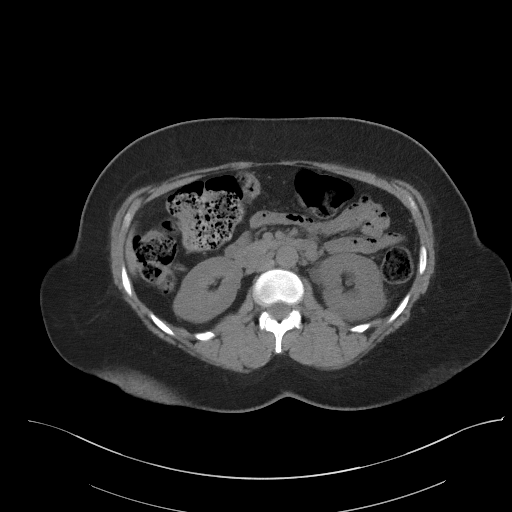
[im 66/99  soft-tissue]
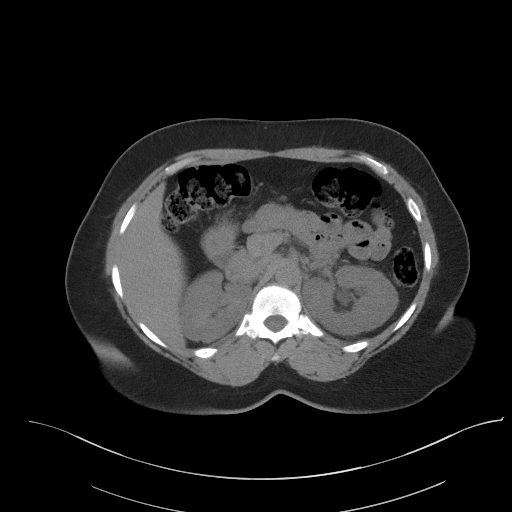
[im 66/99  bone]
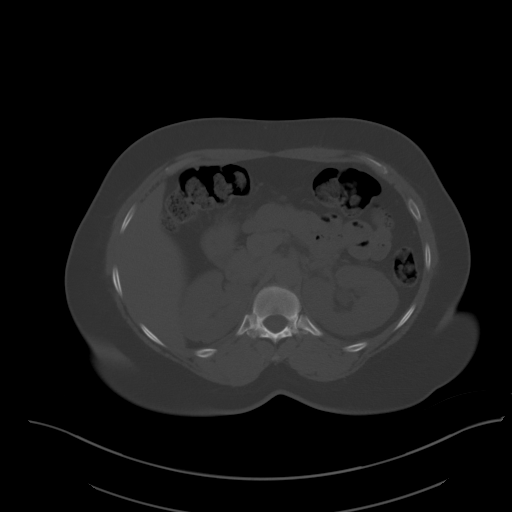
[im 70/99  soft-tissue]
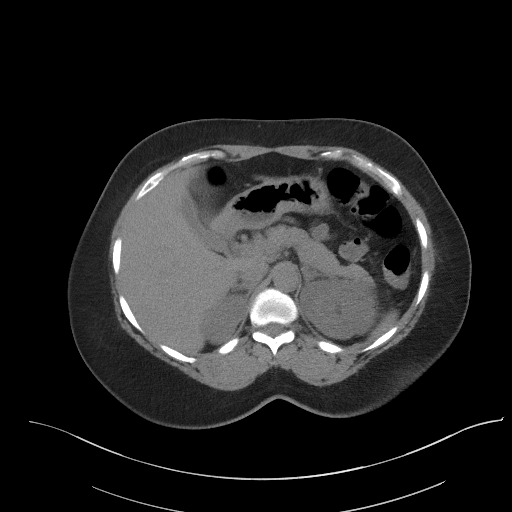
[im 78/99  soft-tissue]
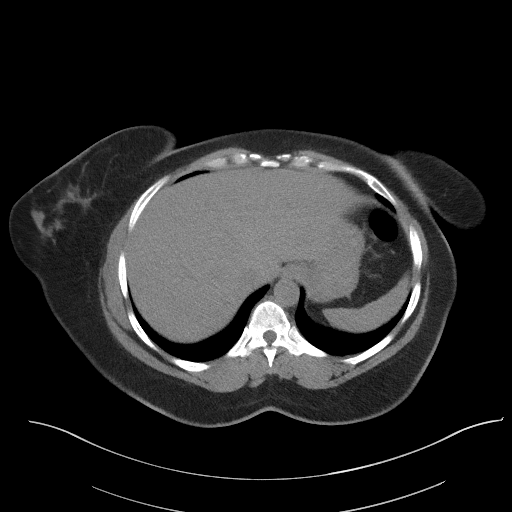
[im 86/99  soft-tissue]
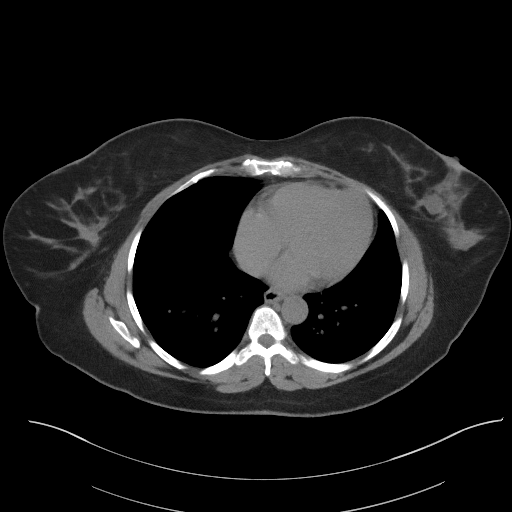
[im 94/99  soft-tissue]
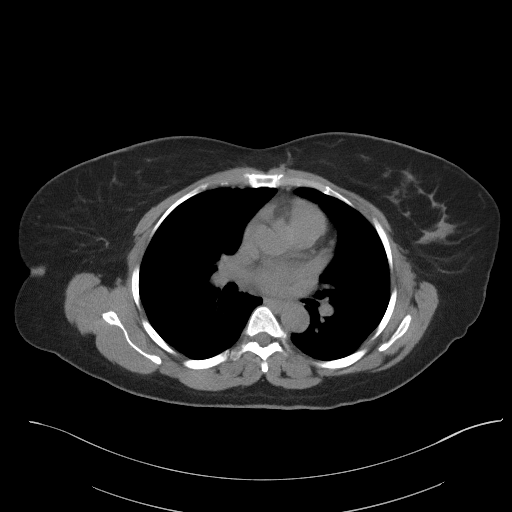

[Series 4: coronal st · coronal · 0.82mm/px · 3 of 75 slices shown]
[im 25/75  soft-tissue]
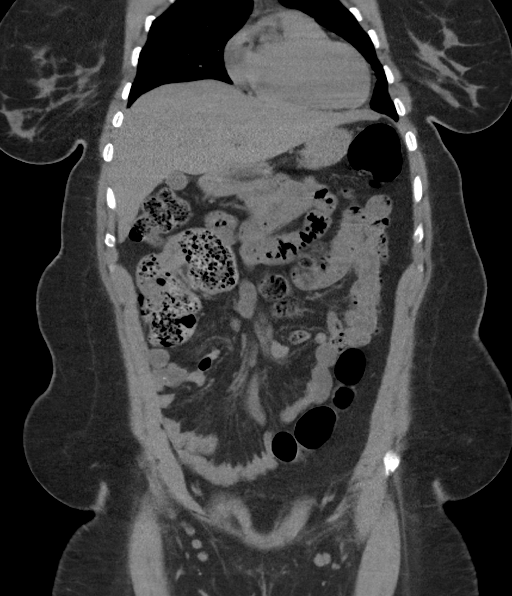
[im 33/75  soft-tissue]
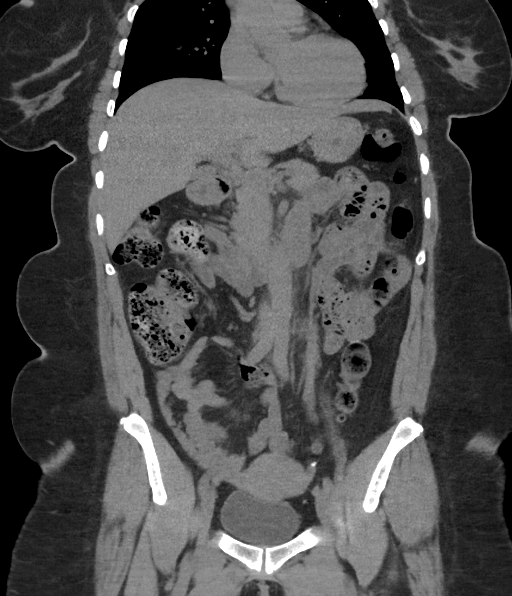
[im 42/75  soft-tissue]
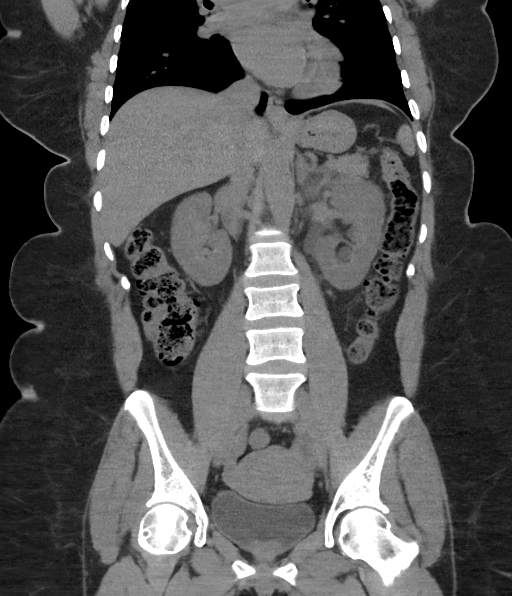

[16 of 46 positions shown; findings below may reference images not displayed]

FINDINGS: Lower chest: Top-normal size heart without pericardial effusion. No
active pulmonary disease.

Hepatobiliary: No space-occupying mass of the unenhanced liver. No
biliary dilatation. Nondistended gallbladder without calculus.

Pancreas: Normal

Spleen: Normal

Adrenals/Urinary Tract: Normal bilateral adrenal glands. Punctate
nonobstructing left lower pole renal calculus measuring 2 mm. Mild
left-sided hydroureteronephrosis attributable to what appears to be
a punctate 1-2 mm calculus just proximal to left UVJ. Adjacent more
medially situated pair of phleboliths correspond with calcifications
seen on the lumbar spine radiographs. Similar phlebolith in the
right hemipelvis corresponding with the calcifications seen on prior
lumbar spine radiograph. Physiologic distention of the urinary
bladder without focal mural thickening or calculus.

Stomach/Bowel: Staple line at the rectosigmoid junction.
Decompressed stomach. Normal small bowel rotation without
inflammation. Moderate stool burden within the colon. The appendix
is not visualized but no findings of acute appendicitis. Scattered
left-sided colonic diverticulosis without acute diverticulitis.

Vascular/Lymphatic: No significant vascular findings are present. No
enlarged abdominal or pelvic lymph nodes.

Reproductive: Uterus and bilateral adnexa are unremarkable.

Other: No abdominal wall hernia or abnormality. No abdominopelvic
ascites.

Musculoskeletal: No acute or significant osseous findings.
IMPRESSION: 1. Mild left-sided hydroureteronephrosis believed attributable to a
distal left ureteral 1-2 mm calculus.
2. A nonobstructing 2 mm left lower pole renal calculus is also
noted.
3. Staple line noted at the rectosigmoid. Moderate colonic stool
burden without obstruction or inflammation. Minimal colonic
diverticulosis without acute diverticulitis.

## 2019-12-23 IMAGING — DX DG ANKLE COMPLETE 3+V*L*
3 series · 3 of 3 positions shown · non-contrast
Comparison: None.

CLINICAL DATA: Table fell on leg with pain

EXAM:
LEFT ANKLE COMPLETE - 3+ VIEW

[ankle ap]
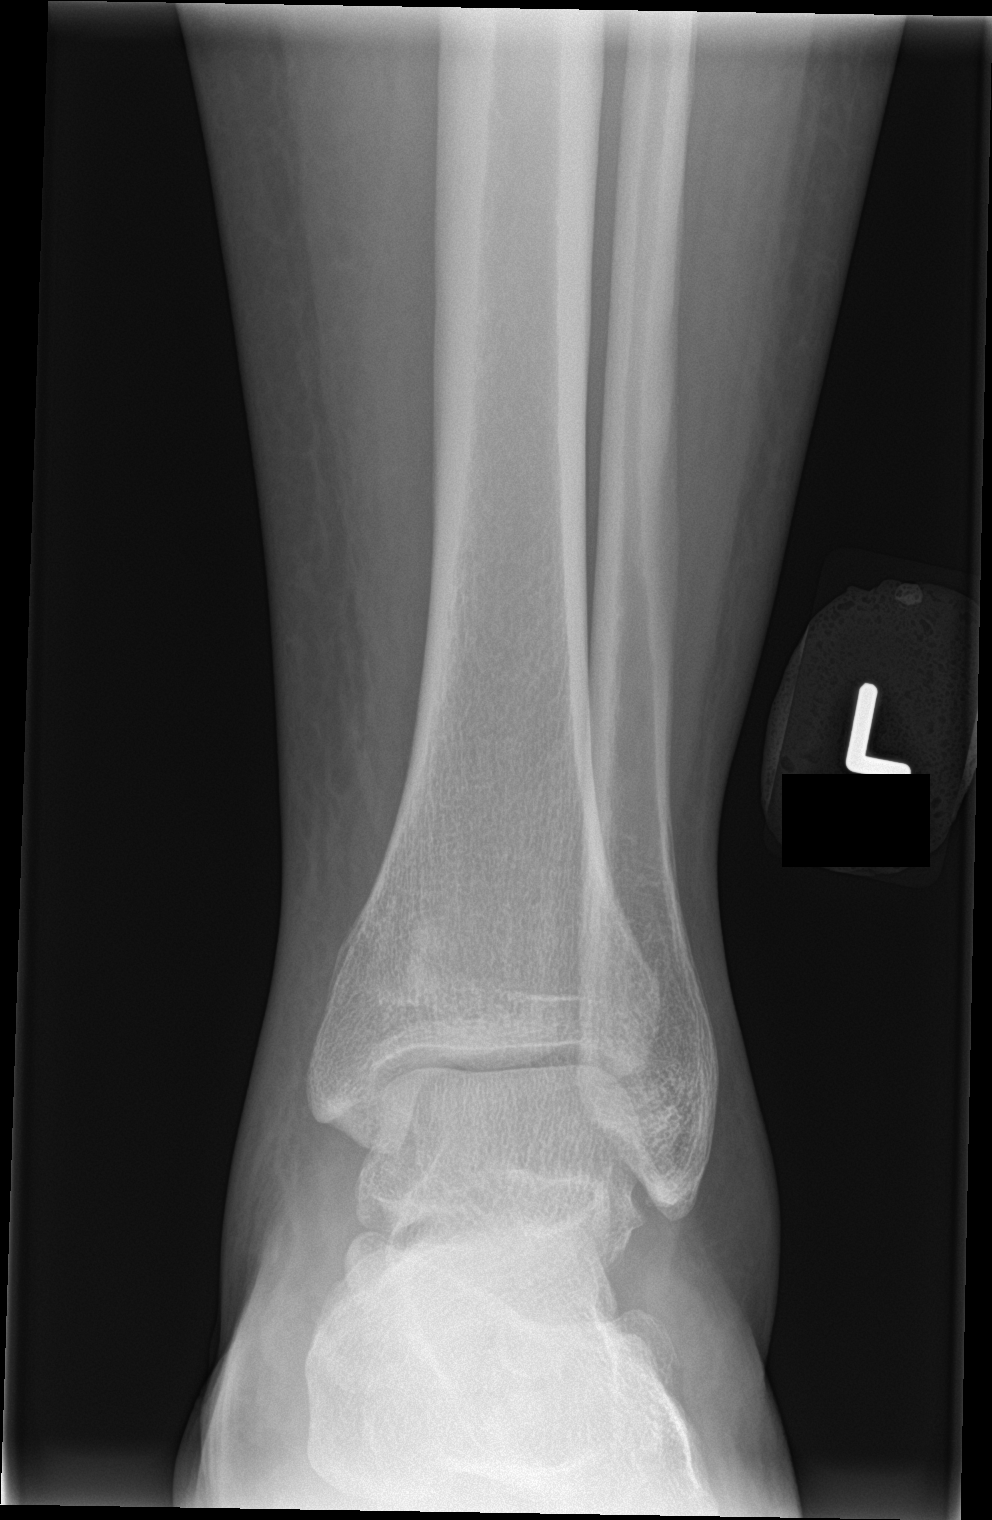

[ankle obl]
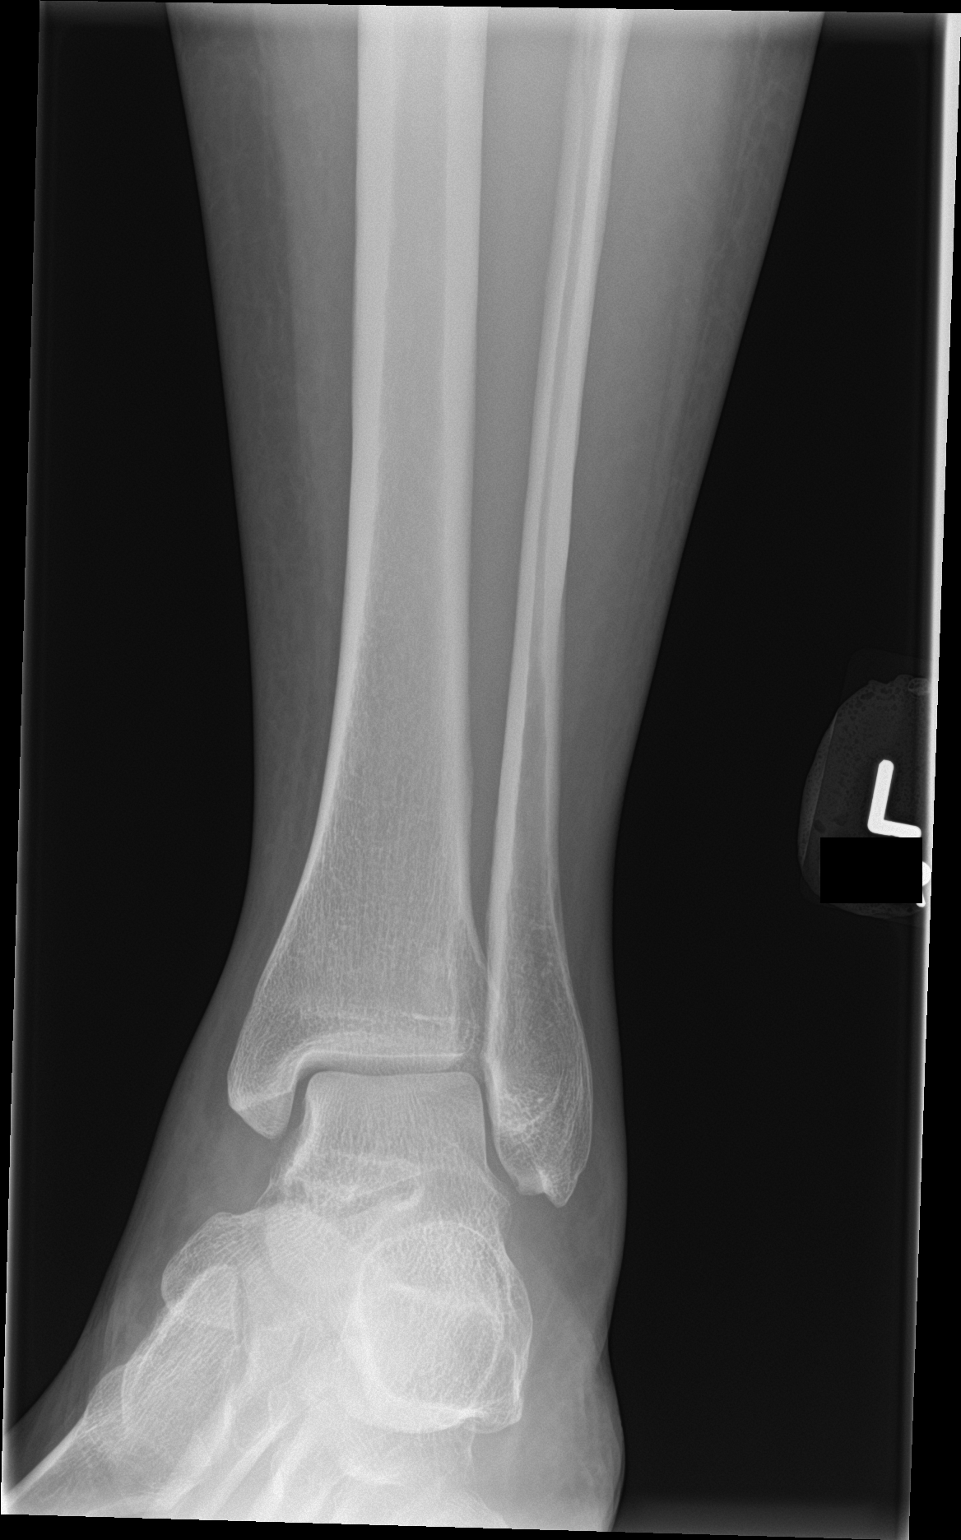

[ankle lat]
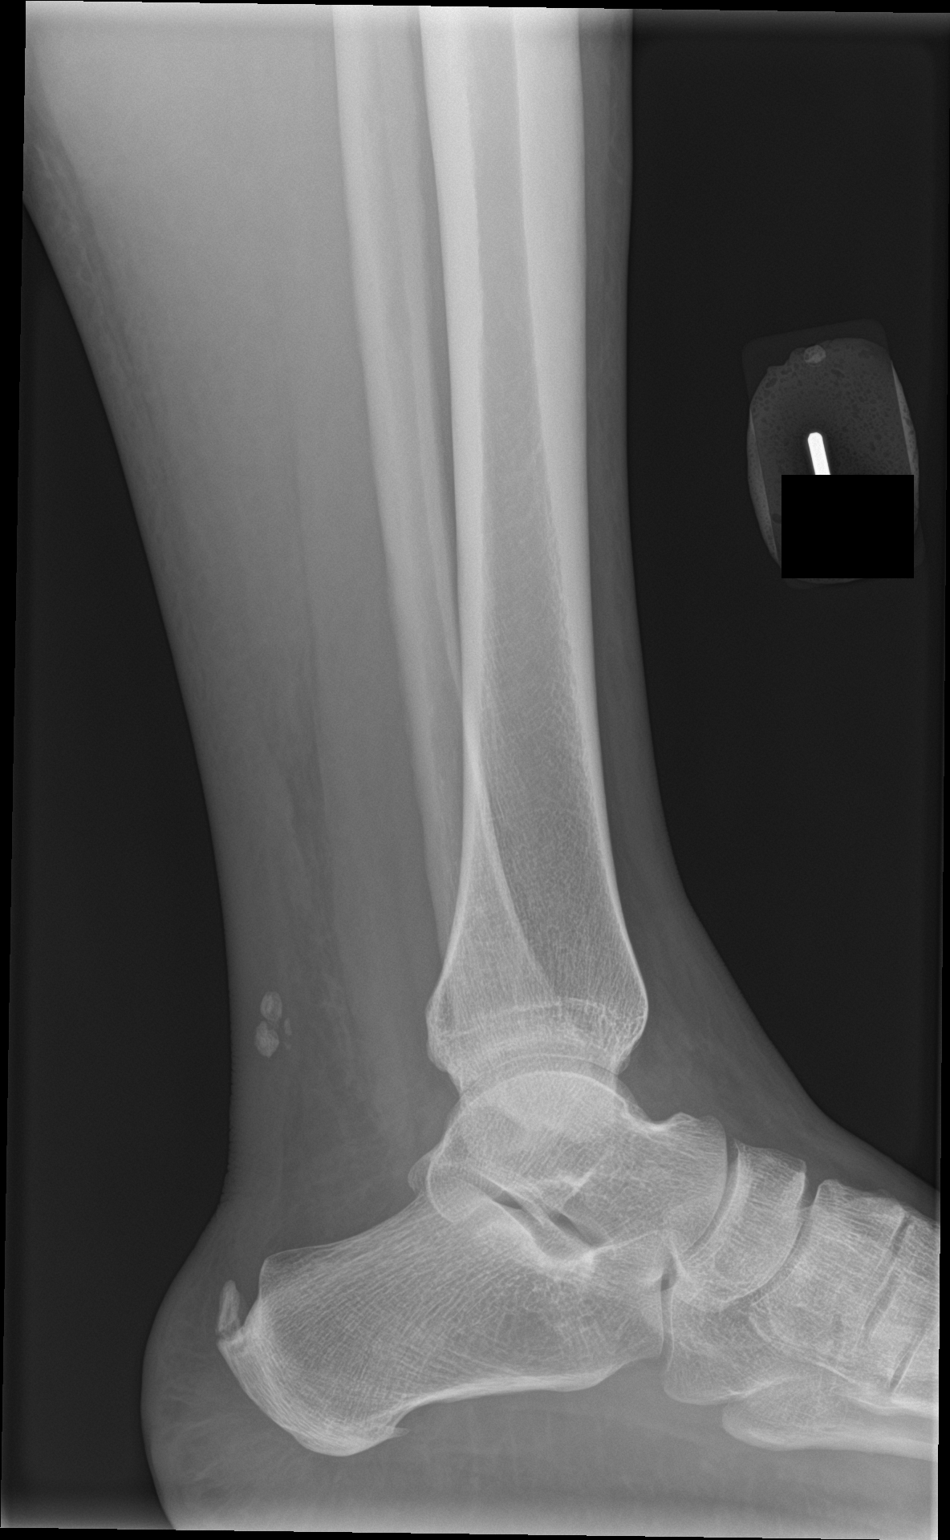

[3 of 3 positions shown; findings below may reference images not displayed]

FINDINGS: No fracture or malalignment. Ankle mortise is symmetric. Prominent
posterior calcaneal spurring. Small plantar calcaneal spur. Soft
tissue swelling.
IMPRESSION: No acute osseous abnormality

## 2020-08-05 ENCOUNTER — Other Ambulatory Visit: Payer: Self-pay | Admitting: Physician Assistant

## 2020-08-05 DIAGNOSIS — Z1231 Encounter for screening mammogram for malignant neoplasm of breast: Secondary | ICD-10-CM

## 2021-03-26 ENCOUNTER — Inpatient Hospital Stay: Admission: RE | Admit: 2021-03-26 | Payer: Self-pay | Source: Ambulatory Visit

## 2021-06-05 ENCOUNTER — Other Ambulatory Visit (HOSPITAL_COMMUNITY): Payer: Self-pay

## 2021-06-05 MED ORDER — MOUNJARO 10 MG/0.5ML ~~LOC~~ SOAJ
SUBCUTANEOUS | 2 refills | Status: AC
  Filled 2021-06-05 (×2): qty 2, 28d supply, fill #0
  Filled 2021-06-29 – 2021-07-14 (×2): qty 2, 28d supply, fill #1

## 2021-06-16 ENCOUNTER — Other Ambulatory Visit (HOSPITAL_COMMUNITY): Payer: Self-pay

## 2021-06-16 MED ORDER — MOUNJARO 5 MG/0.5ML ~~LOC~~ SOAJ
SUBCUTANEOUS | 0 refills | Status: AC
  Filled 2021-06-16 (×2): qty 2, 28d supply, fill #0

## 2021-06-17 ENCOUNTER — Other Ambulatory Visit (HOSPITAL_COMMUNITY): Payer: Self-pay

## 2021-06-29 ENCOUNTER — Other Ambulatory Visit (HOSPITAL_COMMUNITY): Payer: Self-pay

## 2021-07-07 ENCOUNTER — Other Ambulatory Visit (HOSPITAL_COMMUNITY): Payer: Self-pay

## 2021-07-14 ENCOUNTER — Other Ambulatory Visit (HOSPITAL_COMMUNITY): Payer: Self-pay

## 2021-07-29 ENCOUNTER — Other Ambulatory Visit (HOSPITAL_COMMUNITY): Payer: Self-pay

## 2021-07-29 MED ORDER — MOUNJARO 12.5 MG/0.5ML ~~LOC~~ SOAJ: SUBCUTANEOUS | 2 refills | Status: AC

## 2021-07-29 MED ORDER — MOUNJARO 10 MG/0.5ML ~~LOC~~ SOAJ
SUBCUTANEOUS | 3 refills | Status: AC
  Filled 2021-07-29 – 2021-08-21 (×2): qty 2, 28d supply, fill #0

## 2021-08-05 ENCOUNTER — Other Ambulatory Visit (HOSPITAL_COMMUNITY): Payer: Self-pay

## 2021-08-17 ENCOUNTER — Other Ambulatory Visit (HOSPITAL_COMMUNITY): Payer: Self-pay

## 2021-08-21 ENCOUNTER — Other Ambulatory Visit (HOSPITAL_COMMUNITY): Payer: Self-pay

## 2021-08-22 ENCOUNTER — Encounter (HOSPITAL_COMMUNITY): Payer: Self-pay | Admitting: Emergency Medicine

## 2021-08-22 ENCOUNTER — Ambulatory Visit (HOSPITAL_COMMUNITY)
Admission: EM | Admit: 2021-08-22 | Discharge: 2021-08-22 | Disposition: A | Discharge status: Home / Self Care | Payer: 59 | Attending: Emergency Medicine | Admitting: Emergency Medicine

## 2021-08-22 DIAGNOSIS — L237 Allergic contact dermatitis due to plants, except food: Secondary | ICD-10-CM | POA: Diagnosis not present

## 2021-08-22 MED ORDER — STERILE WATER FOR INJECTION IJ SOLN
INTRAMUSCULAR | Status: AC
  Filled 2021-08-22: qty 10

## 2021-08-22 MED ORDER — PREDNISONE 10 MG (21) PO TBPK: ORAL_TABLET | Freq: Every day | ORAL | 0 refills | Status: AC

## 2021-08-22 MED ORDER — METHYLPREDNISOLONE SODIUM SUCC 125 MG IJ SOLR
INTRAMUSCULAR | Status: AC
  Filled 2021-08-22: qty 2

## 2021-08-22 MED ORDER — METHYLPREDNISOLONE SODIUM SUCC 125 MG IJ SOLR
60.0000 mg | Freq: Once | INTRAMUSCULAR | Status: AC
  Administered 2021-08-22: 60 mg via INTRAMUSCULAR

## 2021-08-22 NOTE — ED Provider Notes (Signed)
[facility]    CSN: [id number] Arrival date & time: 08/22/21  1520      History   Chief Complaint Chief Complaint  Patient presents with   Rash    HPI BRANDAN SOCKWELL is a 56 y.o. female.   Patient presents with a rash to the bilateral arms for 7 days beginning after working in the yard.  Initially began on the right arm but since then spread to the left.  Rash is erythematous and pruritic and patient endorses scratching and patting the area for comfort.  Has attempted use of calamine spray, alcohol, topical Benadryl, baking soda, apple cider vinegar and Neosporin which have all been ineffective.  Denies fever, chills or drainage.  Denies changes in soaps, lotions detergents, fragrances, recent travel or dietary changes.  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      Home Medications    Prior to Admission medications   Medication Sig Start Date End Date Taking? Authorizing Provider  amphetamine-dextroamphetamine (ADDERALL) 15 MG tablet Take 1 tablet by mouth 2 (two) times daily. 12/31/16   [provider]  cholecalciferol (VITAMIN D) 1000 UNITS tablet Take 1,000 Units by mouth daily.    [provider]  citalopram (CELEXA) 20 MG tablet Take 1 tablet (20 mg total) by mouth daily. 01/05/11 01/05/12  Sheliah Hatch, MD  cyclobenzaprine (FLEXERIL) 10 MG tablet Take 1 tablet (10 mg total) by mouth 3 (three) times daily as needed for muscle spasms.  01/03/13   Elwin Mocha, MD  diazepam (VALIUM) 5 MG tablet Take 1 by mouth 1 to 2 hours pre-procedure. May repeat if necessary. 06/24/17   Tyrell Antonio, MD  HYDROcodone-acetaminophen (NORCO/VICODIN) 5-325 MG tablet Take 1-2 tablets by mouth every 4 (four) hours as needed. 05/10/17   Rolan Bucco, MD  hydrOXYzine (VISTARIL) 25 MG capsule Take 25 mg by mouth at bedtime. 03/10/17   [provider]  ibuprofen (ADVIL) 800 MG tablet Take 1 tablet (800 mg total) by mouth every 8 (eight) hours as needed (for pain). 11/11/18   Molpus, John, MD  ibuprofen (ADVIL,MOTRIN) 600 MG tablet Take 1 tablet (600 mg total) by mouth every 6 (six) hours as needed for pain. 11/23/11   Mabe, Latanya Maudlin, MD  meloxicam (MOBIC) 15 MG tablet Take 15 mg by mouth daily. 02/25/17   [provider]  methocarbamol (ROBAXIN) 500 MG tablet Take 1 tablet (500 mg total) by mouth 4 (four) times daily. 11/14/14   Renne Crigler, PA-C  metroNIDAZOLE (FLAGYL) 500 MG tablet Take 1 tablet (500 mg total) by mouth 2 (two) times daily. One po bid x 7 days 05/10/17   Rolan Bucco, MD  naproxen (NAPROSYN) 500 MG tablet Take 1 tablet (500 mg total) by mouth 2 (two) times daily. 10/19/15   Rancour, Jeannett Senior, MD  nortriptyline (PAMELOR) 25 MG capsule Take 25 mg by mouth at  bedtime.    [provider]  ondansetron (ZOFRAN ODT) 4 MG disintegrating tablet 4mg  ODT q4 hours prn nausea/vomit 05/10/17   Rolan Bucco, MD  oxyCODONE-acetaminophen (PERCOCET/ROXICET) 5-325 MG per tablet Take 1 tablet by mouth every 6 (six) hours as needed for pain. 05/15/12   Gwyneth Sprout, MD  PHENTERMINE HCL PO Take by mouth.    [provider]  tamsulosin (FLOMAX) 0.4 MG CAPS capsule Take 1 capsule (0.4 mg total) by mouth daily. 05/10/17   Rolan Bucco, MD  tirzepatide [facility]) 10 MG/0.5ML Pen Inject 10 mg into the skin once a week. 05/25/21   Roe Rutherford, NP  tirzepatide [facility]) 10 MG/0.5ML Pen Inject 10 mg into the skin once a  week. 07/29/21     tirzepatide (MOUNJARO) 12.5 MG/0.5ML Pen Inject 12.5 mg into the skin once a week. 07/29/21     tirzepatide (MOUNJARO) 5 MG/0.5ML Pen Inject 5 mg  into the skin once a week. 06/16/21       Family History Family History  Problem Relation Age of Onset   Asthma Mother    Asthma Brother    Emphysema Maternal Grandfather     Social History Social History   Tobacco Use   Smoking status: Never   Smokeless tobacco: Never  Vaping Use   Vaping Use: Never used  Substance Use Topics   Alcohol use: No   Drug use: No     Allergies   Amoxicillin   Review of Systems Review of Systems  Constitutional: Negative.   Respiratory: Negative.    Cardiovascular: Negative.   Skin:  Positive for rash. Negative for color change, pallor and wound.  Neurological: Negative.      Physical Exam Triage Vital Signs ED Triage Vitals  Enc Vitals Group     BP 08/22/21 1608 (!) 141/87     Pulse Rate 08/22/21 1608 91     Resp 08/22/21 1608 18     Temp 08/22/21 1608 98.2 F (36.8 C)     Temp Source 08/22/21 1608 Oral     SpO2 08/22/21 1608 97 %     Weight --      Height --      Head Circumference --      Peak Flow --      Pain Score 08/22/21 1605 0     Pain Loc --      Pain Edu? --      Excl. in GC? --    No data found.  Updated Vital Signs BP (!) 141/87 (BP Location: Left Arm)   Pulse 91   Temp 98.2 F (36.8 C) (Oral)   Resp 18   SpO2 97%   Visual Acuity Right Eye Distance:   Left Eye Distance:   Bilateral Distance:    Right Eye Near:   Left Eye Near:    Bilateral Near:     Physical Exam Constitutional:      Appearance: Normal appearance.  HENT:     Head: Normocephalic.  Eyes:     Extraocular Movements: Extraocular movements intact.  Pulmonary:     Effort: Pulmonary effort is normal.  Skin:    Comments: Erythematous papular blisterlike rash present to the bilateral upper extremities, nontender, nondraining  Neurological:     Mental Status: She is  alert and oriented to person, place, and time. Mental status is at baseline.  Psychiatric:        Mood and Affect: Mood normal.        Behavior: Behavior  normal.      UC Treatments / Results  Labs (all labs ordered are listed, but only abnormal results are displayed) Labs Reviewed - No data to display  EKG   Radiology No results found.  Procedures Procedures (including critical care time)  Medications Ordered in UC Medications - No data to display  Initial Impression / Assessment and Plan / UC Course  I have reviewed the triage vital signs and the nursing notes.  Pertinent labs & imaging results that were available during my care of the patient were reviewed by me and considered in my medical decision making (see chart for details).  Poison ivy dermatitis  Presentation is consistent with poison ivy rash, discussed with patient, methylprednisolone injection given in office and prednisone 60 mg taper prescribed for outpatient management, may continue use of calamine spray, topical Benadryl and Neosporin for management of pruritus, advised against scratching to decrease risk for secondary infection, given strict precautions to follow-up for any new or worsening rash for reevaluation Final Clinical Impressions(s) / UC Diagnoses   Final diagnoses:  None   Discharge Instructions   None    ED Prescriptions   None    PDMP not reviewed this encounter.   Valinda Hoar, NP 08/22/21 1627

## 2021-08-31 ENCOUNTER — Other Ambulatory Visit (HOSPITAL_COMMUNITY): Payer: Self-pay

## 2021-09-09 ENCOUNTER — Other Ambulatory Visit (HOSPITAL_COMMUNITY): Payer: Self-pay

## 2021-09-09 MED ORDER — SAXENDA 18 MG/3ML ~~LOC~~ SOPN
PEN_INJECTOR | SUBCUTANEOUS | 5 refills | Status: AC
  Filled 2021-09-09: qty 3, 30d supply, fill #0

## 2021-09-16 ENCOUNTER — Other Ambulatory Visit (HOSPITAL_COMMUNITY): Payer: Self-pay

## 2021-09-16 MED ORDER — SAXENDA 18 MG/3ML ~~LOC~~ SOPN
PEN_INJECTOR | SUBCUTANEOUS | 5 refills | Status: DC
  Filled 2021-09-16: qty 15, 30d supply, fill #0

## 2021-09-21 ENCOUNTER — Other Ambulatory Visit (HOSPITAL_COMMUNITY): Payer: Self-pay

## 2021-10-09 ENCOUNTER — Ambulatory Visit: Payer: 59 | Admitting: Orthopaedic Surgery

## 2022-03-03 ENCOUNTER — Other Ambulatory Visit (HOSPITAL_COMMUNITY): Payer: Self-pay

## 2022-03-03 MED ORDER — ZEPBOUND 5 MG/0.5ML ~~LOC~~ SOAJ
5.0000 mg | SUBCUTANEOUS | 2 refills | Status: AC
  Filled 2022-03-03: qty 2, 28d supply, fill #0

## 2022-03-08 ENCOUNTER — Other Ambulatory Visit (HOSPITAL_COMMUNITY): Payer: Self-pay

## 2022-03-10 ENCOUNTER — Other Ambulatory Visit (HOSPITAL_COMMUNITY): Payer: Self-pay

## 2022-03-10 MED ORDER — WEGOVY 0.5 MG/0.5ML ~~LOC~~ SOAJ
0.5000 mg | SUBCUTANEOUS | 0 refills | Status: DC
  Filled 2022-03-10: qty 2, 28d supply, fill #0

## 2022-03-15 ENCOUNTER — Other Ambulatory Visit (HOSPITAL_COMMUNITY): Payer: Self-pay

## 2022-04-29 ENCOUNTER — Other Ambulatory Visit (HOSPITAL_COMMUNITY): Payer: Self-pay

## 2022-05-07 ENCOUNTER — Other Ambulatory Visit (HOSPITAL_COMMUNITY): Payer: Self-pay

## 2022-09-07 ENCOUNTER — Other Ambulatory Visit (HOSPITAL_COMMUNITY): Payer: Self-pay

## 2022-09-07 MED ORDER — TIZANIDINE HCL 4 MG PO TABS
ORAL_TABLET | ORAL | 1 refills | Status: AC
Start: 2022-09-07 — End: ?
  Filled 2022-09-07: qty 30, 10d supply, fill #0

## 2022-09-07 MED ORDER — SULFAMETHOXAZOLE-TRIMETHOPRIM 800-160 MG PO TABS
ORAL_TABLET | ORAL | 0 refills | Status: AC
Start: 2022-09-07 — End: ?
  Filled 2022-09-07: qty 6, 3d supply, fill #0

## 2022-09-08 ENCOUNTER — Other Ambulatory Visit (HOSPITAL_COMMUNITY): Payer: Self-pay

## 2022-11-02 ENCOUNTER — Other Ambulatory Visit (HOSPITAL_COMMUNITY): Payer: Self-pay

## 2022-11-02 MED ORDER — WEGOVY 0.25 MG/0.5ML ~~LOC~~ SOAJ
0.2500 mg | SUBCUTANEOUS | 3 refills | Status: DC
  Filled 2022-11-02: qty 2, 28d supply, fill #0
  Filled 2022-11-23 – 2022-11-25 (×2): qty 2, 28d supply, fill #1

## 2022-11-02 MED ORDER — HYDROCHLOROTHIAZIDE 12.5 MG PO CAPS
12.5000 mg | ORAL_CAPSULE | Freq: Every day | ORAL | 2 refills | Status: AC
  Filled 2022-11-02: qty 90, 90d supply, fill #0

## 2022-11-03 ENCOUNTER — Other Ambulatory Visit (HOSPITAL_COMMUNITY): Payer: Self-pay

## 2022-11-20 ENCOUNTER — Other Ambulatory Visit (HOSPITAL_COMMUNITY): Payer: Self-pay

## 2022-11-23 ENCOUNTER — Other Ambulatory Visit (HOSPITAL_COMMUNITY): Payer: Self-pay

## 2022-11-25 ENCOUNTER — Other Ambulatory Visit: Payer: Self-pay

## 2022-11-27 ENCOUNTER — Other Ambulatory Visit (HOSPITAL_COMMUNITY): Payer: Self-pay

## 2022-12-01 ENCOUNTER — Other Ambulatory Visit (HOSPITAL_COMMUNITY): Payer: Self-pay

## 2022-12-01 MED ORDER — WEGOVY 0.5 MG/0.5ML ~~LOC~~ SOAJ
0.5000 mg | SUBCUTANEOUS | 3 refills | Status: DC
  Filled 2022-12-01: qty 2, 28d supply, fill #0

## 2022-12-03 ENCOUNTER — Other Ambulatory Visit (HOSPITAL_COMMUNITY): Payer: Self-pay

## 2022-12-03 MED ORDER — LISDEXAMFETAMINE DIMESYLATE 20 MG PO CAPS
20.0000 mg | ORAL_CAPSULE | Freq: Every morning | ORAL | 0 refills | Status: DC
  Filled 2022-12-03 (×2): qty 30, 30d supply, fill #0

## 2022-12-03 MED ORDER — VITAMIN D3 1.25 MG (50000 UT) PO CAPS
1.0000 | ORAL_CAPSULE | ORAL | 3 refills | Status: AC
  Filled 2022-12-03: qty 4, 28d supply, fill #0
  Filled 2022-12-28: qty 4, 28d supply, fill #1

## 2022-12-03 MED ORDER — WEGOVY 0.5 MG/0.5ML ~~LOC~~ SOAJ
0.5000 mg | SUBCUTANEOUS | 3 refills | Status: AC
  Filled 2022-12-03 (×4): qty 2, 28d supply, fill #0
  Filled 2022-12-28: qty 2, 28d supply, fill #1

## 2022-12-06 ENCOUNTER — Other Ambulatory Visit (HOSPITAL_COMMUNITY): Payer: Self-pay

## 2022-12-06 MED ORDER — METHOCARBAMOL 500 MG PO TABS
ORAL_TABLET | ORAL | 0 refills | Status: DC
Start: 2022-12-06 — End: 2022-12-28
  Filled 2022-12-06: qty 40, 10d supply, fill #0

## 2022-12-07 ENCOUNTER — Other Ambulatory Visit (HOSPITAL_COMMUNITY): Payer: Self-pay

## 2022-12-07 MED ORDER — TRAMADOL HCL 50 MG PO TABS
50.0000 mg | ORAL_TABLET | Freq: Four times a day (QID) | ORAL | 0 refills | Status: AC
Start: 2022-12-07 — End: ?
  Filled 2022-12-07: qty 40, 10d supply, fill #0

## 2022-12-20 ENCOUNTER — Other Ambulatory Visit (HOSPITAL_COMMUNITY): Payer: Self-pay

## 2022-12-28 ENCOUNTER — Other Ambulatory Visit (HOSPITAL_COMMUNITY): Payer: Self-pay

## 2022-12-28 ENCOUNTER — Other Ambulatory Visit: Payer: Self-pay

## 2022-12-28 MED ORDER — LISDEXAMFETAMINE DIMESYLATE 20 MG PO CAPS
20.0000 mg | ORAL_CAPSULE | Freq: Every morning | ORAL | 0 refills | Status: AC
  Filled 2023-06-17: qty 30, 30d supply, fill #0

## 2022-12-28 MED ORDER — METHOCARBAMOL 500 MG PO TABS
500.0000 mg | ORAL_TABLET | Freq: Four times a day (QID) | ORAL | 0 refills | Status: AC
  Filled 2022-12-28: qty 40, 10d supply, fill #0

## 2022-12-30 ENCOUNTER — Other Ambulatory Visit (HOSPITAL_COMMUNITY): Payer: Self-pay

## 2023-01-07 ENCOUNTER — Other Ambulatory Visit (HOSPITAL_COMMUNITY): Payer: Self-pay

## 2023-04-11 ENCOUNTER — Other Ambulatory Visit (HOSPITAL_COMMUNITY): Payer: Self-pay

## 2023-04-11 ENCOUNTER — Other Ambulatory Visit: Payer: Self-pay

## 2023-04-11 MED ORDER — HYDROCHLOROTHIAZIDE 12.5 MG PO CAPS
12.5000 mg | ORAL_CAPSULE | Freq: Every day | ORAL | 2 refills | Status: AC
  Filled 2023-04-11: qty 90, 90d supply, fill #0
  Filled 2023-06-17 – 2023-10-06 (×2): qty 90, 90d supply, fill #1

## 2023-04-11 MED ORDER — VITAMIN D3 1.25 MG (50000 UT) PO CAPS
1.0000 | ORAL_CAPSULE | ORAL | 3 refills | Status: AC
  Filled 2023-04-11: qty 4, 28d supply, fill #0
  Filled 2023-05-13: qty 4, 28d supply, fill #1
  Filled 2023-06-17: qty 4, 28d supply, fill #2

## 2023-04-11 MED ORDER — WEGOVY 0.5 MG/0.5ML ~~LOC~~ SOAJ
0.5000 mg | SUBCUTANEOUS | 3 refills | Status: DC
  Filled 2023-04-11: qty 2, 28d supply, fill #0

## 2023-04-11 MED ORDER — PREDNISONE 10 MG PO TABS
ORAL_TABLET | ORAL | 0 refills | Status: AC
  Filled 2023-04-11: qty 21, 12d supply, fill #0

## 2023-04-11 MED ORDER — LISDEXAMFETAMINE DIMESYLATE 20 MG PO CAPS
20.0000 mg | ORAL_CAPSULE | Freq: Every morning | ORAL | 0 refills | Status: AC
  Filled 2023-04-11: qty 30, 30d supply, fill #0

## 2023-04-12 ENCOUNTER — Other Ambulatory Visit (HOSPITAL_COMMUNITY): Payer: Self-pay

## 2023-05-10 ENCOUNTER — Other Ambulatory Visit (HOSPITAL_COMMUNITY): Payer: Self-pay

## 2023-05-10 MED ORDER — DICLOFENAC SODIUM 1 % EX GEL
CUTANEOUS | 2 refills | Status: AC
  Filled 2023-05-10: qty 100, 15d supply, fill #0
  Filled 2023-11-09: qty 100, 30d supply, fill #0

## 2023-05-10 MED ORDER — PHENTERMINE HCL 37.5 MG PO TABS
37.5000 mg | ORAL_TABLET | ORAL | 2 refills | Status: AC
  Filled 2023-05-10: qty 30, 30d supply, fill #0
  Filled 2023-06-17 – 2023-10-06 (×2): qty 30, 30d supply, fill #1

## 2023-05-10 MED ORDER — WEGOVY 1 MG/0.5ML ~~LOC~~ SOAJ
1.0000 mg | SUBCUTANEOUS | 3 refills | Status: AC
  Filled 2023-05-10: qty 2, 28d supply, fill #0

## 2023-05-13 ENCOUNTER — Other Ambulatory Visit (HOSPITAL_COMMUNITY): Payer: Self-pay

## 2023-05-17 ENCOUNTER — Other Ambulatory Visit (HOSPITAL_COMMUNITY): Payer: Self-pay

## 2023-05-17 MED ORDER — PREDNISONE 10 MG PO TABS
ORAL_TABLET | ORAL | 0 refills | Status: AC
  Filled 2023-05-17: qty 21, 12d supply, fill #0

## 2023-05-18 ENCOUNTER — Other Ambulatory Visit (HOSPITAL_COMMUNITY): Payer: Self-pay

## 2023-05-25 ENCOUNTER — Other Ambulatory Visit (HOSPITAL_COMMUNITY): Payer: Self-pay

## 2023-05-25 MED ORDER — WEGOVY 1.7 MG/0.75ML ~~LOC~~ SOAJ
1.7000 mg | SUBCUTANEOUS | 2 refills | Status: AC
  Filled 2023-05-25 – 2023-06-17 (×2): qty 3, 28d supply, fill #0

## 2023-05-27 ENCOUNTER — Other Ambulatory Visit (HOSPITAL_COMMUNITY): Payer: Self-pay

## 2023-06-07 ENCOUNTER — Other Ambulatory Visit (HOSPITAL_COMMUNITY): Payer: Self-pay

## 2023-06-17 ENCOUNTER — Other Ambulatory Visit (HOSPITAL_COMMUNITY): Payer: Self-pay

## 2023-06-27 ENCOUNTER — Other Ambulatory Visit (HOSPITAL_COMMUNITY): Payer: Self-pay

## 2023-06-30 ENCOUNTER — Other Ambulatory Visit (HOSPITAL_COMMUNITY): Payer: Self-pay

## 2023-06-30 MED ORDER — VITAMIN D3 1.25 MG (50000 UT) PO CAPS
1.0000 | ORAL_CAPSULE | ORAL | 3 refills | Status: AC
  Filled 2023-06-30 – 2023-10-06 (×2): qty 4, 28d supply, fill #0
  Filled 2023-11-09: qty 4, 28d supply, fill #1

## 2023-06-30 MED ORDER — WEGOVY 1.7 MG/0.75ML ~~LOC~~ SOAJ
1.7000 mg | SUBCUTANEOUS | 2 refills | Status: AC
  Filled 2023-06-30: qty 3, 28d supply, fill #0

## 2023-07-06 ENCOUNTER — Other Ambulatory Visit (HOSPITAL_COMMUNITY): Payer: Self-pay

## 2023-07-11 ENCOUNTER — Other Ambulatory Visit (HOSPITAL_COMMUNITY): Payer: Self-pay

## 2023-10-03 ENCOUNTER — Other Ambulatory Visit (HOSPITAL_COMMUNITY): Payer: Self-pay

## 2023-10-03 MED ORDER — LISDEXAMFETAMINE DIMESYLATE 30 MG PO CAPS
30.0000 mg | ORAL_CAPSULE | Freq: Every morning | ORAL | 0 refills | Status: DC
  Filled 2023-10-03: qty 30, 30d supply, fill #0

## 2023-10-03 MED ORDER — WEGOVY 0.25 MG/0.5ML ~~LOC~~ SOAJ
0.2500 mg | SUBCUTANEOUS | 2 refills | Status: AC
  Filled 2023-10-03 – 2023-11-09 (×2): qty 2, 28d supply, fill #0

## 2023-10-06 ENCOUNTER — Other Ambulatory Visit (HOSPITAL_COMMUNITY): Payer: Self-pay

## 2023-10-07 ENCOUNTER — Other Ambulatory Visit (HOSPITAL_COMMUNITY): Payer: Self-pay

## 2023-10-10 ENCOUNTER — Other Ambulatory Visit (HOSPITAL_COMMUNITY): Payer: Self-pay

## 2023-10-11 ENCOUNTER — Other Ambulatory Visit (HOSPITAL_COMMUNITY): Payer: Self-pay

## 2023-11-09 ENCOUNTER — Other Ambulatory Visit (HOSPITAL_COMMUNITY): Payer: Self-pay

## 2023-11-09 MED ORDER — LISDEXAMFETAMINE DIMESYLATE 30 MG PO CAPS
30.0000 mg | ORAL_CAPSULE | Freq: Every morning | ORAL | 0 refills | Status: DC
  Filled 2023-11-09 – 2024-03-02 (×5): qty 30, 30d supply, fill #0
  Filled ????-??-??: fill #0

## 2023-11-11 ENCOUNTER — Other Ambulatory Visit (HOSPITAL_COMMUNITY): Payer: Self-pay

## 2023-11-22 ENCOUNTER — Other Ambulatory Visit (HOSPITAL_COMMUNITY): Payer: Self-pay

## 2023-12-21 ENCOUNTER — Other Ambulatory Visit (HOSPITAL_COMMUNITY): Payer: Self-pay

## 2023-12-21 ENCOUNTER — Other Ambulatory Visit: Payer: Self-pay

## 2023-12-21 MED ORDER — LISDEXAMFETAMINE DIMESYLATE 30 MG PO CAPS
30.0000 mg | ORAL_CAPSULE | Freq: Every morning | ORAL | 0 refills | Status: DC
  Filled 2023-12-21: qty 30, 30d supply, fill #0

## 2023-12-21 MED ORDER — HYDROCHLOROTHIAZIDE 12.5 MG PO CAPS
12.5000 mg | ORAL_CAPSULE | Freq: Every day | ORAL | 2 refills | Status: AC
  Filled 2023-12-21: qty 90, 90d supply, fill #0
  Filled 2023-12-21: qty 5, 5d supply, fill #0
  Filled 2023-12-21: qty 85, 85d supply, fill #0

## 2023-12-21 MED ORDER — VITAMIN D3 1.25 MG (50000 UT) PO CAPS
1.0000 | ORAL_CAPSULE | ORAL | 3 refills | Status: AC
  Filled 2023-12-21: qty 4, 28d supply, fill #0
  Filled 2024-01-19: qty 4, 28d supply, fill #1

## 2023-12-22 ENCOUNTER — Other Ambulatory Visit (HOSPITAL_COMMUNITY): Payer: Self-pay

## 2023-12-24 ENCOUNTER — Other Ambulatory Visit (HOSPITAL_COMMUNITY): Payer: Self-pay

## 2024-01-03 ENCOUNTER — Other Ambulatory Visit (HOSPITAL_COMMUNITY): Payer: Self-pay

## 2024-01-03 MED ORDER — NEOMYCIN-POLYMYXIN-DEXAMETH 3.5-10000-0.1 OP SUSP
1.0000 [drp] | Freq: Two times a day (BID) | OPHTHALMIC | 2 refills | Status: AC
  Filled 2024-01-03: qty 5, 10d supply, fill #0

## 2024-01-03 MED ORDER — LISDEXAMFETAMINE DIMESYLATE 40 MG PO CAPS
40.0000 mg | ORAL_CAPSULE | Freq: Every morning | ORAL | 0 refills | Status: DC
  Filled 2024-01-03 – 2024-01-19 (×2): qty 30, 30d supply, fill #0

## 2024-01-13 ENCOUNTER — Other Ambulatory Visit (HOSPITAL_COMMUNITY): Payer: Self-pay

## 2024-01-19 ENCOUNTER — Other Ambulatory Visit (HOSPITAL_COMMUNITY): Payer: Self-pay

## 2024-01-20 ENCOUNTER — Other Ambulatory Visit (HOSPITAL_COMMUNITY): Payer: Self-pay

## 2024-01-23 ENCOUNTER — Other Ambulatory Visit (HOSPITAL_COMMUNITY): Payer: Self-pay

## 2024-03-02 ENCOUNTER — Other Ambulatory Visit: Payer: Self-pay

## 2024-03-02 ENCOUNTER — Other Ambulatory Visit (HOSPITAL_COMMUNITY): Payer: Self-pay

## 2024-03-02 MED ORDER — VITAMIN D (ERGOCALCIFEROL) 1.25 MG (50000 UNIT) PO CAPS
ORAL_CAPSULE | ORAL | 5 refills | Status: AC
  Filled 2024-03-02: qty 4, 28d supply, fill #0
  Filled 2024-03-23: qty 4, 28d supply, fill #1

## 2024-03-02 MED ORDER — AZITHROMYCIN 250 MG PO TABS
ORAL_TABLET | ORAL | 0 refills | Status: AC
  Filled 2024-03-02: qty 6, 5d supply, fill #0

## 2024-03-02 MED ORDER — PREDNISONE 20 MG PO TABS
20.0000 mg | ORAL_TABLET | Freq: Every day | ORAL | 0 refills | Status: AC
  Filled 2024-03-02: qty 5, 5d supply, fill #0

## 2024-03-02 MED ORDER — PROMETHAZINE-DM 6.25-15 MG/5ML PO SYRP
5.0000 mL | ORAL_SOLUTION | Freq: Four times a day (QID) | ORAL | 0 refills | Status: AC
  Filled 2024-03-02: qty 100, 5d supply, fill #0

## 2024-03-02 MED ORDER — ALBUTEROL SULFATE HFA 108 (90 BASE) MCG/ACT IN AERS
2.0000 | INHALATION_SPRAY | Freq: Four times a day (QID) | RESPIRATORY_TRACT | 0 refills | Status: AC
  Filled 2024-03-02: qty 6.7, 25d supply, fill #0

## 2024-03-04 ENCOUNTER — Other Ambulatory Visit (HOSPITAL_COMMUNITY): Payer: Self-pay

## 2024-03-04 MED ORDER — NYSTATIN-TRIAMCINOLONE 100000-0.1 UNIT/GM-% EX CREA
1.0000 | TOPICAL_CREAM | Freq: Two times a day (BID) | CUTANEOUS | 0 refills | Status: AC
  Filled 2024-03-04: qty 15, 8d supply, fill #0

## 2024-03-05 ENCOUNTER — Other Ambulatory Visit: Payer: Self-pay

## 2024-03-05 ENCOUNTER — Other Ambulatory Visit (HOSPITAL_COMMUNITY): Payer: Self-pay

## 2024-03-05 MED ORDER — LISDEXAMFETAMINE DIMESYLATE 40 MG PO CAPS
40.0000 mg | ORAL_CAPSULE | Freq: Every morning | ORAL | 0 refills | Status: AC
  Filled 2024-03-05 – 2024-03-23 (×2): qty 30, 30d supply, fill #0

## 2024-03-15 ENCOUNTER — Other Ambulatory Visit (HOSPITAL_COMMUNITY): Payer: Self-pay

## 2024-03-23 ENCOUNTER — Other Ambulatory Visit (HOSPITAL_COMMUNITY): Payer: Self-pay

## 2024-05-02 ENCOUNTER — Ambulatory Visit
